# Patient Record
Sex: Female | Born: 1970 | Race: White | Hispanic: No | Marital: Married | Smoking: Never smoker
Health system: Southern US, Community
[De-identification: ages and names within clinical notes are randomized; demographics above are authoritative.]

## PROBLEM LIST (undated history)

## (undated) DIAGNOSIS — Z01419 Encounter for gynecological examination (general) (routine) without abnormal findings: Secondary | ICD-10-CM

## (undated) DIAGNOSIS — K589 Irritable bowel syndrome without diarrhea: Secondary | ICD-10-CM

## (undated) DIAGNOSIS — R51 Headache: Secondary | ICD-10-CM

## (undated) DIAGNOSIS — T7840XA Allergy, unspecified, initial encounter: Secondary | ICD-10-CM

## (undated) DIAGNOSIS — F419 Anxiety disorder, unspecified: Secondary | ICD-10-CM

## (undated) DIAGNOSIS — I493 Ventricular premature depolarization: Secondary | ICD-10-CM

## (undated) DIAGNOSIS — R079 Chest pain, unspecified: Secondary | ICD-10-CM

## (undated) HISTORY — PX: ABDOMINAL HYSTERECTOMY: SHX81

## (undated) HISTORY — DX: Anxiety disorder, unspecified: F41.9

## (undated) HISTORY — PX: RHINOPLASTY: SUR1284

## (undated) HISTORY — PX: REDUCTION MAMMAPLASTY: SUR839

## (undated) HISTORY — PX: OTHER SURGICAL HISTORY: SHX169

## (undated) HISTORY — PX: SEPTOPLASTY: SUR1290

## (undated) HISTORY — DX: Allergy, unspecified, initial encounter: T78.40XA

## (undated) HISTORY — DX: Ventricular premature depolarization: I49.3

## (undated) HISTORY — PX: BREAST ENHANCEMENT SURGERY: SHX7

## (undated) HISTORY — PX: DIAGNOSTIC LAPAROSCOPY: SUR761

## (undated) HISTORY — DX: Encounter for gynecological examination (general) (routine) without abnormal findings: Z01.419

## (undated) HISTORY — DX: Chest pain, unspecified: R07.9

## (undated) HISTORY — DX: Irritable bowel syndrome, unspecified: K58.9

## (undated) HISTORY — DX: Headache: R51

---

## 1999-02-15 ENCOUNTER — Other Ambulatory Visit: Admission: RE | Admit: 1999-02-15 | Discharge: 1999-02-15 | Payer: Self-pay | Admitting: *Deleted

## 1999-03-21 ENCOUNTER — Encounter (INDEPENDENT_AMBULATORY_CARE_PROVIDER_SITE_OTHER): Payer: Self-pay | Admitting: Specialist

## 1999-03-21 ENCOUNTER — Other Ambulatory Visit: Admission: RE | Admit: 1999-03-21 | Discharge: 1999-03-21 | Payer: Self-pay | Admitting: *Deleted

## 1999-09-01 ENCOUNTER — Other Ambulatory Visit: Admission: RE | Admit: 1999-09-01 | Discharge: 1999-09-01 | Payer: Self-pay | Admitting: *Deleted

## 2000-01-11 ENCOUNTER — Other Ambulatory Visit: Admission: RE | Admit: 2000-01-11 | Discharge: 2000-01-11 | Payer: Self-pay | Admitting: *Deleted

## 2000-07-02 ENCOUNTER — Other Ambulatory Visit: Admission: RE | Admit: 2000-07-02 | Discharge: 2000-07-02 | Payer: Self-pay | Admitting: Obstetrics and Gynecology

## 2001-06-19 ENCOUNTER — Other Ambulatory Visit: Admission: RE | Admit: 2001-06-19 | Discharge: 2001-06-19 | Payer: Self-pay | Admitting: Obstetrics and Gynecology

## 2001-07-01 ENCOUNTER — Ambulatory Visit (HOSPITAL_COMMUNITY): Admission: RE | Admit: 2001-07-01 | Discharge: 2001-07-01 | Payer: Self-pay | Admitting: Obstetrics and Gynecology

## 2001-07-01 ENCOUNTER — Encounter: Payer: Self-pay | Admitting: Obstetrics and Gynecology

## 2002-10-29 ENCOUNTER — Other Ambulatory Visit: Admission: RE | Admit: 2002-10-29 | Discharge: 2002-10-29 | Payer: Self-pay | Admitting: Gynecology

## 2003-05-14 ENCOUNTER — Ambulatory Visit (HOSPITAL_COMMUNITY): Admission: RE | Admit: 2003-05-14 | Discharge: 2003-05-14 | Payer: Self-pay | Admitting: Gynecology

## 2003-05-14 ENCOUNTER — Encounter (INDEPENDENT_AMBULATORY_CARE_PROVIDER_SITE_OTHER): Payer: Self-pay | Admitting: *Deleted

## 2003-05-14 ENCOUNTER — Ambulatory Visit (HOSPITAL_BASED_OUTPATIENT_CLINIC_OR_DEPARTMENT_OTHER): Admission: RE | Admit: 2003-05-14 | Discharge: 2003-05-14 | Payer: Self-pay | Admitting: Gynecology

## 2003-05-14 HISTORY — PX: DILATION AND EVACUATION: SHX1459

## 2004-08-15 ENCOUNTER — Other Ambulatory Visit: Admission: RE | Admit: 2004-08-15 | Discharge: 2004-08-15 | Payer: Self-pay | Admitting: Obstetrics and Gynecology

## 2004-09-06 ENCOUNTER — Ambulatory Visit (HOSPITAL_COMMUNITY): Admission: RE | Admit: 2004-09-06 | Discharge: 2004-09-06 | Payer: Self-pay | Admitting: Specialist

## 2004-09-08 ENCOUNTER — Ambulatory Visit (HOSPITAL_COMMUNITY): Admission: RE | Admit: 2004-09-08 | Discharge: 2004-09-08 | Payer: Self-pay | Admitting: Specialist

## 2004-09-15 ENCOUNTER — Ambulatory Visit (HOSPITAL_COMMUNITY): Admission: RE | Admit: 2004-09-15 | Discharge: 2004-09-15 | Payer: Self-pay | Admitting: Specialist

## 2005-02-11 ENCOUNTER — Inpatient Hospital Stay (HOSPITAL_COMMUNITY): Admission: AD | Admit: 2005-02-11 | Discharge: 2005-02-11 | Payer: Self-pay | Admitting: Obstetrics and Gynecology

## 2005-04-30 ENCOUNTER — Inpatient Hospital Stay (HOSPITAL_COMMUNITY): Admission: AD | Admit: 2005-04-30 | Discharge: 2005-05-03 | Payer: Self-pay | Admitting: Obstetrics and Gynecology

## 2005-05-22 ENCOUNTER — Ambulatory Visit: Admission: RE | Admit: 2005-05-22 | Discharge: 2005-05-22 | Payer: Self-pay | Admitting: Obstetrics and Gynecology

## 2006-01-01 ENCOUNTER — Ambulatory Visit: Payer: Self-pay | Admitting: Family Medicine

## 2006-10-11 ENCOUNTER — Ambulatory Visit: Payer: Self-pay | Admitting: Family Medicine

## 2006-10-11 DIAGNOSIS — J309 Allergic rhinitis, unspecified: Secondary | ICD-10-CM | POA: Insufficient documentation

## 2006-10-11 DIAGNOSIS — R51 Headache: Secondary | ICD-10-CM

## 2006-10-11 DIAGNOSIS — R519 Headache, unspecified: Secondary | ICD-10-CM | POA: Insufficient documentation

## 2007-01-02 ENCOUNTER — Ambulatory Visit (HOSPITAL_COMMUNITY): Admission: RE | Admit: 2007-01-02 | Discharge: 2007-01-02 | Payer: Self-pay | Admitting: Gynecology

## 2007-03-31 ENCOUNTER — Ambulatory Visit: Payer: Self-pay | Admitting: Family Medicine

## 2007-03-31 DIAGNOSIS — R635 Abnormal weight gain: Secondary | ICD-10-CM

## 2007-04-04 ENCOUNTER — Telehealth: Payer: Self-pay | Admitting: Family Medicine

## 2007-04-04 LAB — CONVERTED CEMR LAB
ALT: 14 units/L (ref 0–35)
Alkaline Phosphatase: 63 units/L (ref 39–117)
Bilirubin, Direct: 0.1 mg/dL (ref 0.0–0.3)
CO2: 30 meq/L (ref 19–32)
Calcium: 9.9 mg/dL (ref 8.4–10.5)
HCT: 41.9 % (ref 36.0–46.0)
Lymphocytes Relative: 34.6 % (ref 12.0–46.0)
MCV: 91.9 fL (ref 78.0–100.0)
Monocytes Absolute: 0.6 10*3/uL (ref 0.2–0.7)
Monocytes Relative: 6.9 % (ref 3.0–11.0)
Neutro Abs: 4.7 10*3/uL (ref 1.4–7.7)
Neutrophils Relative %: 55.8 % (ref 43.0–77.0)
Potassium: 4.1 meq/L (ref 3.5–5.1)
Sodium: 140 meq/L (ref 135–145)
TSH: 0.6 microintl units/mL (ref 0.35–5.50)
Total Protein: 7 g/dL (ref 6.0–8.3)

## 2007-06-30 ENCOUNTER — Telehealth: Payer: Self-pay | Admitting: Family Medicine

## 2007-07-24 ENCOUNTER — Telehealth: Payer: Self-pay | Admitting: Family Medicine

## 2007-07-25 ENCOUNTER — Encounter: Payer: Self-pay | Admitting: Family Medicine

## 2007-10-15 ENCOUNTER — Ambulatory Visit: Payer: Self-pay | Admitting: Family Medicine

## 2007-10-15 DIAGNOSIS — F411 Generalized anxiety disorder: Secondary | ICD-10-CM

## 2007-10-15 DIAGNOSIS — K589 Irritable bowel syndrome without diarrhea: Secondary | ICD-10-CM

## 2007-10-16 ENCOUNTER — Encounter: Payer: Self-pay | Admitting: Family Medicine

## 2007-10-16 ENCOUNTER — Telehealth: Payer: Self-pay | Admitting: Family Medicine

## 2007-11-24 ENCOUNTER — Ambulatory Visit: Payer: Self-pay | Admitting: Family Medicine

## 2007-11-27 ENCOUNTER — Telehealth: Payer: Self-pay | Admitting: Family Medicine

## 2008-01-21 ENCOUNTER — Ambulatory Visit: Payer: Self-pay | Admitting: Family Medicine

## 2008-01-28 ENCOUNTER — Telehealth: Payer: Self-pay | Admitting: Family Medicine

## 2008-03-31 ENCOUNTER — Ambulatory Visit: Payer: Self-pay | Admitting: Family Medicine

## 2008-03-31 DIAGNOSIS — R5383 Other fatigue: Secondary | ICD-10-CM

## 2008-03-31 DIAGNOSIS — R5381 Other malaise: Secondary | ICD-10-CM | POA: Insufficient documentation

## 2008-04-02 LAB — CONVERTED CEMR LAB
ALT: 16 units/L (ref 0–35)
AST: 19 units/L (ref 0–37)
Alkaline Phosphatase: 65 units/L (ref 39–117)
Basophils Absolute: 0.1 10*3/uL (ref 0.0–0.1)
Bilirubin, Direct: 0 mg/dL (ref 0.0–0.3)
Calcium: 9.5 mg/dL (ref 8.4–10.5)
Eosinophils Absolute: 0.1 10*3/uL (ref 0.0–0.7)
Glucose, Bld: 85 mg/dL (ref 70–99)
HCT: 40.8 % (ref 36.0–46.0)
Hemoglobin: 14.1 g/dL (ref 12.0–15.0)
Lymphocytes Relative: 30.1 % (ref 12.0–46.0)
MCHC: 34.6 g/dL (ref 30.0–36.0)
Monocytes Absolute: 0.3 10*3/uL (ref 0.1–1.0)
Monocytes Relative: 3.9 % (ref 3.0–12.0)
Neutro Abs: 5.6 10*3/uL (ref 1.4–7.7)
Potassium: 4.2 meq/L (ref 3.5–5.1)
RDW: 11.7 % (ref 11.5–14.6)
Sodium: 142 meq/L (ref 135–145)
Total Bilirubin: 0.5 mg/dL (ref 0.3–1.2)
Vitamin B-12: 317 pg/mL (ref 211–911)

## 2008-04-07 ENCOUNTER — Telehealth: Payer: Self-pay | Admitting: Family Medicine

## 2008-05-20 ENCOUNTER — Encounter: Payer: Self-pay | Admitting: Family Medicine

## 2008-09-01 ENCOUNTER — Ambulatory Visit: Payer: Self-pay | Admitting: Family Medicine

## 2008-09-03 ENCOUNTER — Ambulatory Visit: Payer: Self-pay | Admitting: Family Medicine

## 2008-09-07 LAB — CONVERTED CEMR LAB
Cholesterol: 174 mg/dL (ref 0–200)
HDL: 53 mg/dL (ref >39.00)
Total CHOL/HDL Ratio: 3

## 2009-01-17 ENCOUNTER — Telehealth: Payer: Self-pay | Admitting: Family Medicine

## 2009-09-05 ENCOUNTER — Telehealth: Payer: Self-pay | Admitting: Family Medicine

## 2010-02-14 NOTE — Progress Notes (Signed)
Summary: refill xanax  Phone Note From Pharmacy   [facility]  [address] [phone] Call For: Chloe Conley  Summary of Call: refill xanax 0.5mg  1 by mouth two times a day as needed for anxiety Initial call taken by: Alfred Levins, CMA,  January 17, 2009 2:29 PM  Follow-up for Phone Call        call in #60 with 5 rf Follow-up by: Nelwyn Salisbury MD,  January 18, 2009 8:35 AM  Additional Follow-up for Phone Call Additional follow up Details #1::        Phone call completed, Pharmacist called Additional Follow-up by: Alfred Levins, CMA,  January 18, 2009 9:12 AM    Prescriptions: ALPRAZOLAM 0.5 MG TABS (ALPRAZOLAM) two times a day as needed anxiety  #60 x 5   Entered by:   Alfred Levins, CMA   Authorized by:   Nelwyn Salisbury MD   Signed by:   Alfred Levins, CMA on 01/18/2009   Method used:   Telephoned to ...       [facility]  [address] [phone][facility] (retail)       [address]       [place], [place]  [address]       Ph: [phone] or [phone]       Fax: [phone]   RxID:   [id number]

## 2010-02-14 NOTE — Progress Notes (Signed)
Summary: refill alprazolam  Phone Note Refill Request Message from:  Fax from Pharmacy on September 05, 2009 3:37 PM  Refills Requested: Medication #1:  ALPRAZOLAM 0.5 MG TABS two times a day as needed anxiety.   Dosage confirmed as above?Dosage Confirmed   Supply Requested: 1 month   Last Refilled: 07/12/2009  Method Requested: Fax to Local Pharmacy Initial call taken by: Raechel Ache, RN,  September 05, 2009 3:38 PM [phone]  Follow-up for Phone Call        call in #60 with 5 rf Follow-up by: Nelwyn Salisbury MD,  September 05, 2009 4:12 PM  Additional Follow-up for Phone Call Additional follow up Details #1::        Rx faxed to pharmacy Additional Follow-up by: Raechel Ache, RN,  September 05, 2009 4:15 PM    Prescriptions: ALPRAZOLAM 0.5 MG TABS (ALPRAZOLAM) two times a day as needed anxiety  #60 x 5   Entered by:   Raechel Ache, RN   Authorized by:   Nelwyn Salisbury MD   Signed by:   Raechel Ache, RN on 09/05/2009   Method used:   Historical   RxID:   [id number]

## 2010-02-22 ENCOUNTER — Telehealth: Payer: Self-pay | Admitting: Family Medicine

## 2010-02-22 NOTE — Telephone Encounter (Signed)
Pt needs new rx phentermine ?mg call into [phone]. Last time pt had med was 2 yrs ago.

## 2010-02-24 MED ORDER — PHENTERMINE HCL 37.5 MG PO CAPS: 37.5000 mg | ORAL_CAPSULE | ORAL | Status: DC

## 2010-02-24 NOTE — Telephone Encounter (Signed)
Pt notified and [facility]

## 2010-02-24 NOTE — Telephone Encounter (Signed)
Call in Phentermine 37.5 mg qd , #30 with 5 rf

## 2010-02-27 ENCOUNTER — Other Ambulatory Visit: Payer: Self-pay | Admitting: Family Medicine

## 2010-02-27 NOTE — Telephone Encounter (Signed)
Pt wants to know why her phentermine is 37.5 mg and her husb is 30mg  and also wants to know if she can take her xanax  With this.

## 2010-02-27 NOTE — Telephone Encounter (Signed)
Has ? About the phentermine rx that was called in. Please return call.

## 2010-02-27 NOTE — Telephone Encounter (Signed)
I gave her the 37.5 dosage because I have just recently started to try it. This is the highest dose. Yes it is okay to take Xanax with it.

## 2010-02-28 ENCOUNTER — Telehealth: Payer: Self-pay

## 2010-02-28 NOTE — Telephone Encounter (Signed)
error 

## 2010-02-28 NOTE — Telephone Encounter (Signed)
Pt aware and verbalized understanding.  

## 2010-03-02 ENCOUNTER — Other Ambulatory Visit: Payer: Self-pay | Admitting: Family Medicine

## 2010-04-12 ENCOUNTER — Other Ambulatory Visit: Payer: Self-pay

## 2010-04-13 MED ORDER — ALPRAZOLAM 0.5 MG PO TABS: 0.5000 mg | ORAL_TABLET | Freq: Two times a day (BID) | ORAL | Status: DC | PRN

## 2010-04-13 NOTE — Telephone Encounter (Signed)
Call in #60 with 5 rf 

## 2010-05-09 ENCOUNTER — Other Ambulatory Visit: Payer: Self-pay | Admitting: Family Medicine

## 2010-05-12 ENCOUNTER — Telehealth: Payer: Self-pay | Admitting: *Deleted

## 2010-05-12 NOTE — Telephone Encounter (Signed)
Pt turned over in bed last night and pulled a muscle between her shoulder blades and is asking for RX from Dr. Clent Ridges as she is going out of town this weekend.

## 2010-05-12 NOTE — Telephone Encounter (Signed)
Call in Flexeril 10 mg tid prn muscle spasms, #60 with 2 rf. Add 800 mg of Motrin every 6  Hours prn

## 2010-05-15 MED ORDER — CYCLOBENZAPRINE HCL 10 MG PO TABS: 10.0000 mg | ORAL_TABLET | Freq: Three times a day (TID) | ORAL | Status: DC | PRN

## 2010-05-17 ENCOUNTER — Other Ambulatory Visit: Payer: Self-pay | Admitting: Obstetrics and Gynecology

## 2010-06-02 NOTE — H&P (Signed)
NAME:  Chloe Conley, Chloe Conley                             ACCOUNT NO.:  [id number]   MEDICAL RECORD NO.:  [id number]                   PATIENT TYPE:  AMB   LOCATION:  [facility]                                 FACILITY:  [facility]   PHYSICIAN:  Juan H. Lily Peer, M.D.             DATE OF BIRTH:  1970-06-08   DATE OF ADMISSION:  DATE OF DISCHARGE:                                HISTORY & PHYSICAL   CHIEF COMPLAINT:  In vitro fertilization twin pregnancy with evidence of  missed AB.   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  Her last menstrual period had been reported to be January 25 which would  place her at approximately 57 1/[redacted] weeks gestation with a due date of  November 2.  She had three embryo transfers on March 9 which would place the  patient at 8 1/[redacted] weeks gestation which would concur with the recent  ultrasound two days ago with evidence of a twin gestation with no evidence  of cardiac activity at 8 1/[redacted] weeks gestation.  Sac #1 was empty and sac #2  was very small with fetal pole but no cardiac activity.  The patient denies  any vaginal bleeding.  She had some abdominal cramping.  She is scheduled to  undergo  D&E this coming Friday, April 29.   PAST MEDICAL HISTORY:  1. The patient has a mother with non-insulin dependent diabetes.   ALLERGIES:  She denies any allergies.   PAST SURGICAL HISTORY:  1. History of laparoscopy and hysteroscopy in the past.   MEDICATIONS:  1. She is currently on Celexa for depression.  2. Prenatal vitamins.  3. She was on progesterone suppository for luteal support this first     trimester.   PHYSICAL EXAMINATION:  VITAL SIGNS:  The patient weighs approximately 150  pounds, 5'7 tall, blood pressure 138/82.  HEENT:  Unremarkable.  The neck is supple.  The trachea is midline.  There  are no carotid bruits.  There is no thyromegaly.  LUNGS:  Clear to  auscultation without any rhonchi or wheezes.  HEART:  Regular rate and rhythm with no murmurs or gallops.  BREAST EXAM:  Not done.  ABDOMEN:  Soft and non-tender without rebound or guarding.  PELVIC:  Bartholin's, urethral and Skein's glands are within normal limits.  Vagina and cervix:  No lesions or discharge.  Uterus is anteverted,  approximately 8 to 10 weeks size.  There are no palpable adnexal masses.  RECTAL EXAM:  Not done.   ASSESSMENT:  [age]-year-old Gravida II, Para 0, now AB 2 with first  trimester missed AB, twin gestation, pregnancy is a result of in vitro  fertilization.   PLAN:  The patient will be taken to the operating room this coming Friday  for  a dilatation and evacuation.  The risks, benefits, pros and cons of the  procedure were discussed including infection, bleeding and trauma to  internal organs.  She will receive antibiotics for prophylaxis.  Also, due  to the fact that this is her second pregnancy loss we will proceed with  submitting tissue for chromosomal studies.  Her and her husband will be  tested to check for their karyotype to make sure there are no abnormal  chromosomes. Later on before her next pregnancy we will repeat her  hysteroscopy in the office. We will also proceed with doing antiphospholipid  antibodies, IgG and IgM and lupus anticoagulant which will be done tomorrow.  We will also be checking her blood type to make sure that she is not a Rh  candidate.  She does have a history of diabetes in her mother and she has  complained of increased weight, so along with the above mentioned blood  tests tomorrow, she will return back to the office the day prior to her  surgery for a fasting blood sugar and a TSH as well.  When she comes back to  the office to see me in three weeks for her postoperative visit we will  discuss these results and then schedule a hysteroscopy accordingly.  She was  given a prescription for Motrin 800 mg to take  t.i.d. after her procedure.  She was seen in the office several weeks back.  She had some rectal bleeding  which turns out that it appears to be a perirectal fissure.  She was given  Hemoccult cards to submit to the office and one out of three specimens was  positive for blood. We will go ahead and repeat this and follow-up probably  in the next couple of months after she recovers from the above. All  questions were answered and we will follow accordingly.  The patient is  scheduled for a dilatation and evacuation on Friday, May 14, 2003 at [facility].                                               Juan H. Lily Peer, M.D.    JHF/MEDQ  D:  05/12/2003  T:  05/12/2003  Job:  [id number]

## 2010-06-02 NOTE — Op Note (Signed)
NAME:  Chloe Conley, Chloe Conley                             ACCOUNT NO.:  [id number]   MEDICAL RECORD NO.:  [id number]                   PATIENT TYPE:  AMB   LOCATION:  [facility]                                 FACILITY:  [facility]   PHYSICIAN:  Juan H. Lily Peer, M.D.             DATE OF BIRTH:  [birth year]   DATE OF PROCEDURE:  05/14/2003  DATE OF DISCHARGE:                                 OPERATIVE REPORT   INDICATIONS FOR PROCEDURE:  A 40 year old, gravida 2, para 0 now AB 2, in  vitro pregnancy who has evidence of a first trimester missed AB.   PREOPERATIVE DIAGNOSES:  First trimester missed abortion with twins.   POSTOPERATIVE DIAGNOSES:  First trimester missed abortion with twins.   ANESTHESIA:  MAC and paracervical block consisting of 2% Xylocaine with  1:100,000 epinephrine.   SURGEON:  Juan H. Lily Peer, M.D.   PROCEDURE:  Dilatation and evacuation.   COMPLICATIONS:  None.   DESCRIPTION OF PROCEDURE:  After the patient was adequately counseled, she  was taken to the operating room where she underwent a successful intravenous  sedation. She was placed in low lithotomy position, the vagina and perineum  were prepped and draped in the usual sterile fashion. A red rubber Roxan Hockey  was inserted in an effort to evacuate his bladder of contents of  approximately 50 mL. She did receive a gram of Cefotan prophylactically.  An  examination under anesthesia demonstrated an 8-10 week size uterus with no  palpable adnexal masses. The anterior cervical lip was grasped with a single  tooth tenaculum and the cervix was serially dilated to a size 33 Pratt  dilator.  A 10 mm suction curette was introduced into the intrauterine  cavity to remove the products of conception. This was interchanged with a  serrated curette to remove the remaining products of conception.  An aliquot  of the specimen will be submitted for carrier type and the rest was sent or  histological evaluation. The patient was given 10 units  of Pitocin and 500  mL of lactated Ringer's for additional uterotonic activity to cut down on  the bleeding.  The patient tolerated the procedure well. Of note, she did  receive 2% lidocaine with 1:100,000 epinephrine which had been infiltrated  into the cervical stroma at the 2, 4, 8 and 10 o'clock position.  The  patient tolerated the procedure well and was transferred to the recovery  room with stable vital signs. Blood loss was less than 75 mL and IV fluids  with 500 mL of lactated Ringer's and her blood type is A positive.                                               Juan H. Lily Peer, M.D.    JHF/MEDQ  D:  05/14/2003  T:  05/14/2003  Job:  [id number]

## 2010-06-02 NOTE — Discharge Summary (Signed)
NAMEGWENDOLYNE, [name] NO.:  [id number]   MEDICAL RECORD NO.:  [id number]          PATIENT TYPE:  INP   LOCATION:  9119                          [facility]   PHYSICIAN:  Carrington Clamp, M.D. DATE OF BIRTH:  [birth year]   DATE OF ADMISSION:  04/30/2005  DATE OF DISCHARGE:  05/03/2005                                 DISCHARGE SUMMARY   FINAL DIAGNOSIS:  Intrauterine gestation at 39 weeks, unstable fetal lie,  desire for cesarean section, and mild postoperative anemia.   PROCEDURE:  Primary low segment transverse cesarean section with vacuum  assistance for delivery of fetal vertex.   SURGEON:  Randye Lobo, M.D.   ASSISTANT:  Luvenia Redden, M.D.   COMPLICATIONS:  None.   This 40 year old G4, P 0-0-3-0, presents with [redacted] weeks gestation at term  with an unstable fetal lie that was noted and followed in the office.  The  fetal presentation has ranged from breech to transverse and to vertex.  The  patient has expressed her desire for an elective cesarean section regardless  of the fetal position.  The patient's antepartum course up to this point had  been complicated by a history of infertility. The patient did have in vitro  fertilization. The patient is advanced maternal age and did have first  trimester screening and normal quad screen, however, declined amniocentesis.  The patient also had a history of uterine adhesions. Otherwise, her  antepartum course had been uncomplicated.  She did have a positive group B  strep culture obtained in the office at 35 weeks.   She is taken to the operating room on April 30, 2005, by Dr. Conley Simmonds  where a primary low segment transverse cesarean section was performed with  vacuum assistance with the delivery of an 8 pound 10 ounce female infant with  Apgars of 9 and 9.  The placenta was noted to be adherent to the anterior  uterine fundus and questionable scar tissue in the right lower uterine  segment.  The tubes and  ovaries were normal. The patient and baby tolerated  the procedure well.  The patient's postoperative course was benign without  any significant fevers.  She did have a hemoglobin drop to 9.5.  She was  felt ready for discharge on postop day three.  She was sent home on a  regular diet, told to decrease her activities, told to continue on iron  supplement twice daily, was given Percocet 1-2 q.4h. p.r.n. pain.  She is to  follow up in the office in four weeks with Dr. Edward Jolly.  She is told to call  with any increased bleeding, pain or problems.  Labs on discharge revealed  the patient had a hemoglobin of 8.6, white blood cell count of 9.5,  platelets of 166,000.      Leilani Able, P.A.-C.      Carrington Clamp, M.D.  Electronically Signed    MB/MEDQ  D:  05/30/2005  T:  05/30/2005  Job:  [id number]

## 2010-06-02 NOTE — Assessment & Plan Note (Signed)
[facility]                            [facility] OFFICE NOTE   Chloe, Conley                          MRN:          [id number]  DATE:01/01/2006                            DOB:          Mar 11, 1970    This is a 40 year old woman here to establish with our practice,  complaining of headaches.  She had had intermittent headaches for years  which she felt were sinus headaches.  These were centered around the  forehead above and behind the eyes.  They are usually mild.  They were  controlled well with over-the-counter pain medications and did not seem  to affect her life very much.  Then, she became pregnant about a year-  and-a-half ago and her headaches completely went away.  She then gave  birth to her child 7 months ago and breast-fed the child for several  months.  She continued to do well during this time, and when she stopped  breastfeeding shortly thereafter she began having menstrual cycles again  and her headaches returned.  However, her headaches are now much more  severe than they were before.  She has several a month but almost always  has one just as her menstrual cycle begins.  It seems to begin in the  left forehead region above the left eye and then generalize to the  entire top of her head.  It is severe, it is throbbing.  She has a  sensitivity to light and sound.  She often is nauseated and may vomit.  She often has to lie down for a while as well.  Generally it is gone in  several hours or maybe the following day.  She has no visual changes, no  other neurologic symptoms.  She has a strong family history of migraine  headaches as noted below.   Other past medical history:  She is a G4, P1.  She and her husband had  extensive infertility workups prior to becoming pregnant and having  their child earlier this year.  She had several laparoscopy procedures.  She had a hysteroscopy.  They even did in vitro fertilization three  times.   None of these were successful.  She had a septoplasty and  rhinoplasty in 1998.  Otherwise unremarkable.   ALLERGIES:  None.   CURRENT MEDICATIONS:  None.   HABITS:  She does not use tobacco or alcohol.   SOCIAL HISTORY:  She is married, she is a Futures trader.   FAMILY HISTORY:  Remarkable for migraine headaches in her mother, also  some diabetes in the immediate family.   OTHER CONSULTING DOCTORS:  She sees Dr. Conley Simmonds for gynecology care  and Dr. Para Skeans for dermatology care.   OBJECTIVE:  Height 5 feet 7 inches, weight 157, BP 122/74, pulse 76 and  regular, temperature 98.0 degrees.  Today she seems to feel fine and she  appears to be quite healthy.  Eyes are clear.  A brief neurologic exam  is within normal limits   ASSESSMENT AND PLAN:  Migraine headaches.  We spent some time describing  the nature of migraine headaches and I gave her information to read  including foods to avoid.  She does  use a fair amount of caffeine and I asked her to try to cut back a great  deal on this.  Will try samples for Imitrex 100 mg tablets to use the  next time she gets a headache.  Also, Phenergan 25 mg tablets to use for  nausea.  She will follow up as needed.     Tera Mater. Clent Ridges, MD  Electronically Signed    SAF/MedQ  DD: 01/02/2006  DT: 01/02/2006  Job #: [id number]

## 2010-06-02 NOTE — Op Note (Signed)
NAMECARRINGTON, MULLENAX NO.:  [id number]   MEDICAL RECORD NO.:  [id number]          PATIENT TYPE:  INP   LOCATION:  9119                          FACILITY:  [facility]   PHYSICIAN:  Randye Lobo, M.D.   DATE OF BIRTH:  May 16, 1970   DATE OF PROCEDURE:  04/30/2005  DATE OF DISCHARGE:                                 OPERATIVE REPORT   PREOPERATIVE DIAGNOSIS:  1.  Intrauterine gestation at 39 weeks.  2.  Unstable fetal lie.  3.  Desire for cesarean section.   POSTOPERATIVE DIAGNOSIS:  1.  Intrauterine gestation at 39 weeks.  2.  Unstable fetal lie.  3.  Desire for cesarean section.   PROCEDURES:  A primary low segment transverse cesarean section with vacuum  assistance for delivery of fetal vertex.   SURGEON:  Conley Simmonds, M.D.   ASSISTANT:  Lodema Hong, M.D.   ANESTHESIA:  Is spinal.   IV FLUIDS:  3000 mL Ringer's lactate.   ESTIMATED BLOOD LOSS:  850 mL   URINE OUTPUT:  200 mL   COMPLICATIONS:  None.   INDICATIONS FOR PROCEDURE:  The patient is a 40 year old gravida 4, para 0-0-  3-0 Caucasian female at 16 weeks' gestation who at term has presented with  an unstable fetal lie noted and followed through the office.  The fetal  presentation has ranged from a breech to transverse to vertex.  The patient  has expressed a desire for elective cesarean section, regardless of fetal  position.  A plan is made to proceed with a primary low segment transverse  cesarean section after risks, benefits, and alternatives were discussed with  her.   FINDINGS:  A viable female was delivered and 9:34 a.m.  The weight was 8  pounds 10 ounces.  The Apgars were 9 at 1 minute and 9 at 5 minutes.  The  presentation was vertex.  The amniotic fluid was noted to be clear.  The  placenta was noted to be adherent to the anterior uterine fundus.  There was  a questionable area of some possible scar tissue noted in the right lower  uterine segment.  The tubes and ovaries were  unremarkable.   SPECIMENS:  None.   PROCEDURE:  The patient was reidentified in the preoperative hold area.  She  was brought to the operating room where a spinal anesthetic was  administered.  The patient's abdomen was sterilely prepped and a Foley  catheter was placed inside the bladder.  She was sterilely draped.   A Pfannenstiel incision was created sharply with scalpel.  The incision was  carried down to the fascia using a combination of a scalpel and monopolar  cautery for hemostasis.  The fascia was incised transversely and the  incision was extended with Mayo scissors bilaterally.  The rectus muscles  were sharply dissected off of the fascia superiorly and inferiorly.  The  rectus muscles were sharply divided in the midline.  The parietal peritoneum  was elevated with two hemostat clamps and entered sharply.  The peritoneal  incision was extended cranially and caudally.  The lower uterine segment was exposed with a bladder retractor and the  bladder flap was sharply created.  A transverse lower uterine segment  incision was created with the scalpel.  The uterine cavity was entered with  a hemostat clamp.  The uterine incision was extended bilaterally in an  upward fashion with the bandage scissors.  There was questionable scar  tissue appreciated when the uterine incision was extended along the right  lower uterine segment.  Membranes were ruptured with an Allis clamp and  clear fluid was noted.  The vertex was noted to be presenting but was high  and off to the patient's right-hand side.  The vertex was elevated to the  uterine incision, fundal pressure was applied and ultimately a Mityvac was  placed and used with appropriate pressure very easily for one attempt in  order to deliver the vertex.  The nares and mouth were suctioned.  The  remainder of the newborn infant was delivered.  The umbilical cord was  doubly clamped and cut the newborn was carried over to the  awaiting  pediatricians in vigorous condition.   Cord blood was obtained. The placenta was manually extracted.  It was  adhered to the anterior uterine fundus as noted above.  The placenta was set  aside for cord blood donation.  The uterine cavity was examined and any  remaining products of conception and membranes were removed.   The uterus was exteriorized for its closure.  It was closed with a double  layer closure of #1 chromic.  The first was a running locked layer and the  second was an imbricating layer.  A figure-of-eight suture was placed in the  right apical region to create hemostasis again using #1 chromic.   The uterus was returned to the peritoneal cavity which was irrigated and  suctioned.  The uterine incision was hemostatic.   The abdomen was closed.  The peritoneum was closed with a running suture of  2-0 Vicryl.  The rectus muscles were reapproximated in the midline with  interrupted sutures of #1 chromic.  The fascia was closed with a running  suture of 0 Vicryl.  The subcutaneous tissue was irrigated and suctioned and  made hemostatic with monopolar cautery.  The skin was closed with staples.  A sterile bandage was placed over this.   This concluded the patient's surgery.  The were no complications.  All  needle, instrument, sponge counts were correct.      Randye Lobo, M.D.  Electronically Signed     BES/MEDQ  D:  04/30/2005  T:  04/30/2005  Job:  [id number]

## 2010-11-24 ENCOUNTER — Telehealth: Payer: Self-pay | Admitting: *Deleted

## 2010-11-24 NOTE — Telephone Encounter (Signed)
Refill request ALPRAZOLAM 0.5 MG TAB #60 X 5  Last filled 10/07/10

## 2010-11-27 NOTE — Telephone Encounter (Signed)
She needs an OV for this  

## 2010-11-27 NOTE — Telephone Encounter (Signed)
Left a message for pt to return call 

## 2010-11-28 NOTE — Telephone Encounter (Signed)
I spoke with pt and she will schedule a office visit. 

## 2010-12-06 ENCOUNTER — Ambulatory Visit: Payer: Self-pay | Admitting: Family Medicine

## 2010-12-11 ENCOUNTER — Ambulatory Visit (INDEPENDENT_AMBULATORY_CARE_PROVIDER_SITE_OTHER): Payer: Managed Care, Other (non HMO) | Admitting: Family Medicine

## 2010-12-11 ENCOUNTER — Encounter: Payer: Self-pay | Admitting: Family Medicine

## 2010-12-11 VITALS — BP 108/64 | HR 68 | Temp 98.3°F | Wt 136.0 lb

## 2010-12-11 DIAGNOSIS — K589 Irritable bowel syndrome without diarrhea: Secondary | ICD-10-CM

## 2010-12-11 DIAGNOSIS — F419 Anxiety disorder, unspecified: Secondary | ICD-10-CM

## 2010-12-11 DIAGNOSIS — F411 Generalized anxiety disorder: Secondary | ICD-10-CM

## 2010-12-11 MED ORDER — CITALOPRAM HYDROBROMIDE 20 MG PO TABS: 20.0000 mg | ORAL_TABLET | Freq: Every day | ORAL | Status: DC

## 2010-12-11 MED ORDER — ALPRAZOLAM 0.5 MG PO TABS: 0.5000 mg | ORAL_TABLET | Freq: Two times a day (BID) | ORAL | Status: DC | PRN

## 2010-12-11 NOTE — Progress Notes (Signed)
  Subjective:    Patient ID: Chloe Conley, female    DOB: 11-11-1970, 40 y.o.   MRN: [id number]  HPI Here to discuss anxiety and IBS. She has done well with Xanax, and she takes this once or twice a day. However she is still anxious almost all the time, and she would like to try a daily medication again. Of the SSRIs she has tried in the past, the Celexa seemed to work the best. She sleeps well. Her husband is in [place] for 2 weeks out of 4 on his job, so she is by herself with the kids a lot. This is a great stress to her.    Review of Systems  Constitutional: Negative.   Respiratory: Negative.   Cardiovascular: Negative.   Psychiatric/Behavioral: Negative for dysphoric mood. The patient is nervous/anxious.        Objective:   Physical Exam  Constitutional: She appears well-developed and well-nourished.  Cardiovascular: Normal rate, regular rhythm, normal heart sounds and intact distal pulses.   Pulmonary/Chest: Effort normal and breath sounds normal.  Psychiatric: She has a normal mood and affect. Her behavior is normal. Judgment and thought content normal.          Assessment & Plan:  Get back on Celexa daily with prn Xanax. Recheck in several weeks

## 2011-01-29 ENCOUNTER — Other Ambulatory Visit (INDEPENDENT_AMBULATORY_CARE_PROVIDER_SITE_OTHER): Payer: Managed Care, Other (non HMO)

## 2011-01-29 DIAGNOSIS — Z0000 Encounter for general adult medical examination without abnormal findings: Secondary | ICD-10-CM

## 2011-01-29 LAB — HEPATIC FUNCTION PANEL: Total Protein: 7.4 g/dL (ref 6.0–8.3)

## 2011-01-29 LAB — CBC WITH DIFFERENTIAL/PLATELET
Eosinophils Absolute: 0.1 10*3/uL (ref 0.0–0.7)
Hemoglobin: 14.1 g/dL (ref 12.0–15.0)
Lymphocytes Relative: 34.3 % (ref 12.0–46.0)
Monocytes Relative: 7.3 % (ref 3.0–12.0)
Neutro Abs: 3.4 10*3/uL (ref 1.4–7.7)
Platelets: 216 10*3/uL (ref 150.0–400.0)

## 2011-01-29 LAB — POCT URINALYSIS DIPSTICK
Ketones, UA: NEGATIVE
Leukocytes, UA: NEGATIVE
Spec Grav, UA: >=1.03
Urobilinogen, UA: 0.2

## 2011-01-29 LAB — BASIC METABOLIC PANEL
BUN: 13 mg/dL (ref 6–23)
CO2: 28 mEq/L (ref 19–32)
Creatinine, Ser: 1 mg/dL (ref 0.4–1.2)
Glucose, Bld: 84 mg/dL (ref 70–99)
Sodium: 141 mEq/L (ref 135–145)

## 2011-01-29 LAB — LIPID PANEL
HDL: 70.7 mg/dL (ref >39.00)
Total CHOL/HDL Ratio: 3

## 2011-01-29 LAB — LDL CHOLESTEROL, DIRECT: Direct LDL: 120.7 mg/dL

## 2011-01-30 NOTE — Progress Notes (Signed)
Quick Note:  Spoke with pt ______ 

## 2011-02-05 ENCOUNTER — Encounter: Payer: Managed Care, Other (non HMO) | Admitting: Family Medicine

## 2011-02-08 ENCOUNTER — Telehealth: Payer: Self-pay | Admitting: Family Medicine

## 2011-02-08 MED ORDER — CITALOPRAM HYDROBROMIDE 20 MG PO TABS: 20.0000 mg | ORAL_TABLET | Freq: Every day | ORAL | Status: DC

## 2011-02-08 NOTE — Telephone Encounter (Signed)
done

## 2011-02-08 NOTE — Telephone Encounter (Signed)
Refill request for Citalopram 20 mg take 1 po qd and a 90 day supply to [facility], pt last here on 12/11/10.

## 2011-04-02 ENCOUNTER — Encounter: Payer: Managed Care, Other (non HMO) | Admitting: Family Medicine

## 2011-05-16 ENCOUNTER — Encounter: Payer: Managed Care, Other (non HMO) | Admitting: Family Medicine

## 2011-05-31 ENCOUNTER — Ambulatory Visit (INDEPENDENT_AMBULATORY_CARE_PROVIDER_SITE_OTHER): Payer: Self-pay | Admitting: Family Medicine

## 2011-05-31 ENCOUNTER — Encounter: Payer: Self-pay | Admitting: Family Medicine

## 2011-05-31 VITALS — BP 110/62 | HR 99 | Temp 97.7°F | Ht 66.5 in | Wt 132.0 lb

## 2011-05-31 DIAGNOSIS — Z0000 Encounter for general adult medical examination without abnormal findings: Secondary | ICD-10-CM

## 2011-05-31 NOTE — Progress Notes (Signed)
  Subjective:    Patient ID: Chloe Conley, female    DOB: 1970-12-04, 41 y.o.   MRN: [id number]  HPI 41 yr old female for a cpx. She feels great and has no concerns. She averages 1-2 migraines a week but they are mild for the most part. She does Zumba for exercise.    Review of Systems  Constitutional: Negative.   HENT: Negative.   Eyes: Negative.   Respiratory: Negative.   Cardiovascular: Negative.   Gastrointestinal: Negative.   Genitourinary: Negative for dysuria, urgency, frequency, hematuria, flank pain, decreased urine volume, enuresis, difficulty urinating, pelvic pain and dyspareunia.  Musculoskeletal: Negative.   Skin: Negative.   Neurological: Negative.   Hematological: Negative.   Psychiatric/Behavioral: Negative.        Objective:   Physical Exam  Constitutional: She is oriented to person, place, and time. She appears well-developed and well-nourished. No distress.  HENT:  Head: Normocephalic and atraumatic.  Right Ear: External ear normal.  Left Ear: External ear normal.  Nose: Nose normal.  Mouth/Throat: Oropharynx is clear and moist. No oropharyngeal exudate.  Eyes: Conjunctivae and EOM are normal. Pupils are equal, round, and reactive to light. No scleral icterus.  Neck: Normal range of motion. Neck supple. No JVD present. No thyromegaly present.  Cardiovascular: Normal rate, regular rhythm, normal heart sounds and intact distal pulses.  Exam reveals no gallop and no friction rub.   No murmur heard. Pulmonary/Chest: Effort normal and breath sounds normal. No respiratory distress. She has no wheezes. She has no rales. She exhibits no tenderness.  Abdominal: Soft. Bowel sounds are normal. She exhibits no distension and no mass. There is no tenderness. There is no rebound and no guarding.  Musculoskeletal: Normal range of motion. She exhibits no edema and no tenderness.  Lymphadenopathy:    She has no cervical adenopathy.  Neurological: She is alert and oriented to  person, place, and time. She has normal reflexes. No cranial nerve deficit. She exhibits normal muscle tone. Coordination normal.  Skin: Skin is warm and dry. No rash noted. No erythema.  Psychiatric: She has a normal mood and affect. Her behavior is normal. Judgment and thought content normal.          Assessment & Plan:  Well exam.

## 2011-07-10 ENCOUNTER — Telehealth: Payer: Self-pay | Admitting: Family Medicine

## 2011-07-10 NOTE — Telephone Encounter (Signed)
Refill request for Alprazolam 0.5 mg take 1 po bid prn and pt here on 05/31/11.

## 2011-07-10 NOTE — Telephone Encounter (Signed)
Call in #60 with 5 rf 

## 2011-07-11 MED ORDER — ALPRAZOLAM 0.5 MG PO TABS: 0.5000 mg | ORAL_TABLET | Freq: Two times a day (BID) | ORAL | Status: DC | PRN

## 2011-07-11 NOTE — Telephone Encounter (Signed)
I called in script 

## 2011-10-26 ENCOUNTER — Telehealth: Payer: Self-pay | Admitting: Family Medicine

## 2011-10-26 NOTE — Telephone Encounter (Signed)
Call in #12 with 11 rf 

## 2011-10-26 NOTE — Telephone Encounter (Signed)
Pt is on sumatriptan. She used to use mail order, but with their new insurance, [facility] [place]. [facility]. Thank you. Pt has 24 tabs left, just wants the rx on file w[facility] so it is there when she runs out.

## 2011-10-26 NOTE — Telephone Encounter (Signed)
How many can we send in?

## 2011-10-29 MED ORDER — SUMATRIPTAN SUCCINATE 100 MG PO TABS: 100.0000 mg | ORAL_TABLET | ORAL | Status: DC | PRN

## 2011-10-29 NOTE — Telephone Encounter (Signed)
I sent script e-scribe. 

## 2011-12-10 ENCOUNTER — Other Ambulatory Visit: Payer: Self-pay | Admitting: Family Medicine

## 2012-01-23 ENCOUNTER — Ambulatory Visit (INDEPENDENT_AMBULATORY_CARE_PROVIDER_SITE_OTHER): Payer: BC Managed Care – PPO | Admitting: Family Medicine

## 2012-01-23 ENCOUNTER — Encounter: Payer: Self-pay | Admitting: Family Medicine

## 2012-01-23 VITALS — BP 110/82 | HR 105 | Temp 98.7°F | Wt 144.0 lb

## 2012-01-23 DIAGNOSIS — J111 Influenza due to unidentified influenza virus with other respiratory manifestations: Secondary | ICD-10-CM

## 2012-01-23 MED ORDER — ALPRAZOLAM 0.5 MG PO TABS: 0.5000 mg | ORAL_TABLET | Freq: Two times a day (BID) | ORAL | Status: DC | PRN

## 2012-01-23 MED ORDER — OSELTAMIVIR PHOSPHATE 75 MG PO CAPS: 75.0000 mg | ORAL_CAPSULE | Freq: Two times a day (BID) | ORAL | Status: DC

## 2012-01-23 NOTE — Progress Notes (Signed)
  Subjective:    Patient ID: Chloe Conley, female    DOB: 1970-12-19, 42 y.o.   MRN: [id number]  HPI Here for 3 days of body aches, drenching sweats, chills, HA, nausea without vomiting, and a dry cough. No diarrhea. Drinking fluids.    Review of Systems  Constitutional: Positive for fever, chills, diaphoresis and fatigue.  HENT: Negative.   Eyes: Negative.   Respiratory: Positive for cough.   Gastrointestinal: Positive for nausea. Negative for vomiting, abdominal pain, diarrhea, constipation, blood in stool and abdominal distention.  Genitourinary: Negative.        Objective:   Physical Exam  Constitutional: She appears well-developed and well-nourished. No distress.  HENT:  Right Ear: External ear normal.  Left Ear: External ear normal.  Nose: Nose normal.  Mouth/Throat: Oropharynx is clear and moist.  Eyes: Conjunctivae normal are normal.  Pulmonary/Chest: Effort normal and breath sounds normal.  Abdominal: Soft. Bowel sounds are normal. She exhibits no distension and no mass. There is no tenderness. There is no rebound and no guarding.  Lymphadenopathy:    She has no cervical adenopathy.          Assessment & Plan:  Influenza. Given Tamiflu. Add Mucinex and Delsym prn

## 2012-03-13 ENCOUNTER — Telehealth: Payer: Self-pay | Admitting: Family Medicine

## 2012-03-13 NOTE — Telephone Encounter (Signed)
Patient is requesting a new rx on Phentermine. Per [facility] she had taken 37.5mg , but in Centric[facility], she was taking 30mg . She PREFERS the 30mg . She hasn't been on it in some time, but has been seen recently by Dr. Clent Ridges. Wants to know if she can get new rx without OV. She will be in in May 2014 for physical and labs.  [facility].

## 2012-03-14 MED ORDER — PHENTERMINE HCL 30 MG PO CAPS: 30.0000 mg | ORAL_CAPSULE | ORAL | Status: DC

## 2012-03-14 NOTE — Telephone Encounter (Signed)
Call in 30 mg daily for 6 months

## 2012-03-14 NOTE — Telephone Encounter (Signed)
Rx called in to pharmacy. 

## 2012-05-26 ENCOUNTER — Other Ambulatory Visit: Payer: Self-pay | Admitting: Family Medicine

## 2012-05-29 NOTE — Telephone Encounter (Signed)
Okay for one year  

## 2012-06-27 ENCOUNTER — Telehealth: Payer: Self-pay | Admitting: Family Medicine

## 2012-06-27 MED ORDER — SUMATRIPTAN SUCCINATE 100 MG PO TABS: 100.0000 mg | ORAL_TABLET | ORAL | Status: DC | PRN

## 2012-06-27 NOTE — Telephone Encounter (Signed)
I sent script e-scribe. 

## 2012-06-27 NOTE — Telephone Encounter (Signed)
PT calling to inquire about her SUMAtriptan (IMITREX) 100 MG tablet RX. She wanted to know if there was any way she could obtain a larger quantity on her RX. She's down to 3 tablets, and she is concerned about running out completely. She states that with the pressure in the air lately, she's had terrible migraines, and had to use it more often than usual. [facility].

## 2012-06-27 NOTE — Telephone Encounter (Signed)
Refill for #30 with 11 rf

## 2012-07-09 ENCOUNTER — Other Ambulatory Visit: Payer: Self-pay | Admitting: Family Medicine

## 2012-07-11 ENCOUNTER — Telehealth: Payer: Self-pay | Admitting: Family Medicine

## 2012-07-11 NOTE — Telephone Encounter (Signed)
I left voice message, if pt is indeed taking more than 9 of these per month, then Dr. Clent Ridges recommends a office visit to discuss this.

## 2012-07-11 NOTE — Telephone Encounter (Signed)
Patient's prior auth for #30 for 30 on her sumatriptan will not be approved. New regulations for [employer] [place] regarding triptan meds for migraines require: - patients experiencing more than 4 migraines/month must have a 2-3 month trial of prophylactic therapy before pt can get more than 9 tablets of triptan per month.   She can get 9 (100mg  tabs) per month w/ no prior auth.   Please advise. Thank you.

## 2012-07-22 ENCOUNTER — Other Ambulatory Visit: Payer: Self-pay | Admitting: Family Medicine

## 2012-07-23 NOTE — Telephone Encounter (Signed)
Call in #60 with 5 rf 

## 2012-08-18 ENCOUNTER — Ambulatory Visit (INDEPENDENT_AMBULATORY_CARE_PROVIDER_SITE_OTHER): Payer: BC Managed Care – PPO | Admitting: Family Medicine

## 2012-08-18 ENCOUNTER — Encounter: Payer: Self-pay | Admitting: Family Medicine

## 2012-08-18 VITALS — BP 116/76 | HR 72 | Temp 98.3°F | Wt 146.0 lb

## 2012-08-18 DIAGNOSIS — R51 Headache: Secondary | ICD-10-CM

## 2012-08-18 MED ORDER — TOPIRAMATE 25 MG PO TABS: 25.0000 mg | ORAL_TABLET | Freq: Every day | ORAL | Status: DC

## 2012-08-18 NOTE — Progress Notes (Signed)
  Subjective:    Patient ID: Chloe Conley, female    DOB: Sep 23, 1970, 42 y.o.   MRN: [id number]  HPI Her for her migraines. Over the past few months her HAs have become more frequent, often having 4-5 a week. They have not changed in nature or intensity. Imitrex usually works well for her. She is exercising again.    Review of Systems  Constitutional: Negative.   Respiratory: Negative.   Cardiovascular: Negative.   Neurological: Positive for headaches. Negative for dizziness, tremors, seizures, syncope, facial asymmetry, speech difficulty, weakness, light-headedness and numbness.       Objective:   Physical Exam  Constitutional: She is oriented to person, place, and time. She appears well-developed and well-nourished.  Eyes: Conjunctivae and EOM are normal. Pupils are equal, round, and reactive to light.  Neurological: She is alert and oriented to person, place, and time.          Assessment & Plan:  We will start on Topamax daily. Recheck in 3 weeks.

## 2012-09-23 ENCOUNTER — Other Ambulatory Visit (INDEPENDENT_AMBULATORY_CARE_PROVIDER_SITE_OTHER): Payer: BC Managed Care – PPO

## 2012-09-23 DIAGNOSIS — Z0000 Encounter for general adult medical examination without abnormal findings: Secondary | ICD-10-CM

## 2012-09-23 LAB — BASIC METABOLIC PANEL
BUN: 11 mg/dL (ref 6–23)
CO2: 28 mEq/L (ref 19–32)
Calcium: 9.5 mg/dL (ref 8.4–10.5)
Chloride: 105 mEq/L (ref 96–112)
GFR: 59.17 mL/min — ABNORMAL LOW (ref >60.00)
Potassium: 4 mEq/L (ref 3.5–5.1)
Sodium: 139 mEq/L (ref 135–145)

## 2012-09-23 LAB — HEPATIC FUNCTION PANEL
AST: 19 U/L (ref 0–37)
Albumin: 4.2 g/dL (ref 3.5–5.2)
Alkaline Phosphatase: 46 U/L (ref 39–117)
Bilirubin, Direct: 0 mg/dL (ref 0.0–0.3)
Total Bilirubin: 0.6 mg/dL (ref 0.3–1.2)

## 2012-09-23 LAB — CBC WITH DIFFERENTIAL/PLATELET
Basophils Absolute: 0 10*3/uL (ref 0.0–0.1)
Eosinophils Relative: 1.5 % (ref 0.0–5.0)
HCT: 42.2 % (ref 36.0–46.0)
Lymphs Abs: 2.4 10*3/uL (ref 0.7–4.0)
MCHC: 33.7 g/dL (ref 30.0–36.0)
MCV: 92.5 fl (ref 78.0–100.0)
Monocytes Relative: 8.5 % (ref 3.0–12.0)
Platelets: 249 10*3/uL (ref 150.0–400.0)
RBC: 4.56 Mil/uL (ref 3.87–5.11)
WBC: 6.5 10*3/uL (ref 4.5–10.5)

## 2012-09-23 LAB — LIPID PANEL
HDL: 62.2 mg/dL (ref >39.00)
Triglycerides: 76 mg/dL (ref 0.0–149.0)

## 2012-09-23 LAB — POCT URINALYSIS DIPSTICK
Bilirubin, UA: NEGATIVE
Ketones, UA: NEGATIVE
Urobilinogen, UA: 0.2

## 2012-09-26 NOTE — Progress Notes (Signed)
Quick Note:  Pt has appointment on 09/30/12 will go over then. ______ 

## 2012-09-30 ENCOUNTER — Encounter: Payer: Self-pay | Admitting: Family Medicine

## 2012-09-30 ENCOUNTER — Ambulatory Visit (INDEPENDENT_AMBULATORY_CARE_PROVIDER_SITE_OTHER): Payer: BC Managed Care – PPO | Admitting: Family Medicine

## 2012-09-30 VITALS — BP 108/76 | HR 105 | Temp 98.0°F | Ht 66.75 in | Wt 142.0 lb

## 2012-09-30 DIAGNOSIS — Z23 Encounter for immunization: Secondary | ICD-10-CM

## 2012-09-30 DIAGNOSIS — Z0000 Encounter for general adult medical examination without abnormal findings: Secondary | ICD-10-CM

## 2012-09-30 MED ORDER — TOPIRAMATE 50 MG PO TABS: 50.0000 mg | ORAL_TABLET | Freq: Every day | ORAL | Status: DC

## 2012-09-30 NOTE — Progress Notes (Signed)
  Subjective:    Patient ID: Chloe Conley, female    DOB: 02-24-1970, 42 y.o.   MRN: [id number]  HPI 42 yr old female for a cpx. She is doing well in general. She has been on Topamax for about 6 weeks and has had a good response with her migraines. They are much less frequent now. She would like to increase the dose a bit. Also she has had trouble with bleeding hemorrhoids. She recently had an exam with her GYN, and she has referred to Dr. Loreta Ave for this.    Review of Systems  Constitutional: Negative.   HENT: Negative.   Eyes: Negative.   Respiratory: Negative.   Cardiovascular: Negative.   Gastrointestinal: Negative.   Genitourinary: Negative for dysuria, urgency, frequency, hematuria, flank pain, decreased urine volume, enuresis, difficulty urinating, pelvic pain and dyspareunia.  Musculoskeletal: Negative.   Skin: Negative.   Neurological: Negative.   Psychiatric/Behavioral: Negative.        Objective:   Physical Exam  Constitutional: She is oriented to person, place, and time. She appears well-developed and well-nourished. No distress.  HENT:  Head: Normocephalic and atraumatic.  Right Ear: External ear normal.  Left Ear: External ear normal.  Nose: Nose normal.  Mouth/Throat: Oropharynx is clear and moist. No oropharyngeal exudate.  Eyes: Conjunctivae and EOM are normal. Pupils are equal, round, and reactive to light. No scleral icterus.  Neck: Normal range of motion. Neck supple. No JVD present. No thyromegaly present.  Cardiovascular: Normal rate, regular rhythm, normal heart sounds and intact distal pulses.  Exam reveals no gallop and no friction rub.   No murmur heard. Pulmonary/Chest: Effort normal and breath sounds normal. No respiratory distress. She has no wheezes. She has no rales. She exhibits no tenderness.  Abdominal: Soft. Bowel sounds are normal. She exhibits no distension and no mass. There is no tenderness. There is no rebound and no guarding.  Musculoskeletal:  Normal range of motion. She exhibits no edema and no tenderness.  Lymphadenopathy:    She has no cervical adenopathy.  Neurological: She is alert and oriented to person, place, and time. She has normal reflexes. No cranial nerve deficit. She exhibits normal muscle tone. Coordination normal.  Skin: Skin is warm and dry. No rash noted. No erythema.  Psychiatric: She has a normal mood and affect. Her behavior is normal. Judgment and thought content normal.          Assessment & Plan:  Well exam. Increase Topamax to 50 mg qhs.

## 2012-10-06 ENCOUNTER — Telehealth: Payer: Self-pay | Admitting: Family Medicine

## 2012-10-06 MED ORDER — CEPHALEXIN 500 MG PO CAPS: 500.0000 mg | ORAL_CAPSULE | Freq: Four times a day (QID) | ORAL | Status: DC

## 2012-10-06 NOTE — Telephone Encounter (Signed)
Patient does not have fever but diarrhea is continuing.  She has tried imodium, Pepto, and cutting her penicillin in half.  Please advise.

## 2012-10-06 NOTE — Telephone Encounter (Signed)
Rx sent to pharmacy and patient is aware 

## 2012-10-06 NOTE — Telephone Encounter (Signed)
Pt reports that on Thursday she began having loose stools.  Pt reports she is having 1-2 loose stools per day (dark green color, not formed).  Pt states her stomach feels bad all the time, no pain just a sick feeling.  On Friday she began having a sore throat and enlarged lymph nodes. [facility] and tested positive for strep.  Pt is taking Penicillin for her throat infection.  Since taking the Penicillin her stomach sickness feels worse (diarrhea has not increased but the her stomach feels upset all the time).  Pt wants to know if there is any other medication that she can take that may be easier on her stomach.  Office, please follow up with pt.

## 2012-10-06 NOTE — Telephone Encounter (Signed)
Stop the current antibiotic and switch to Keflex 500 mg tid. Call in #30

## 2012-12-22 ENCOUNTER — Other Ambulatory Visit: Payer: Self-pay | Admitting: Family Medicine

## 2012-12-23 ENCOUNTER — Telehealth: Payer: Self-pay | Admitting: Family Medicine

## 2012-12-23 NOTE — Telephone Encounter (Signed)
Caller: Chloe Conley/Patient; Phone: [phone]; Reason for Call: Chloe Conley says she had a colonoscopy 12/22/12 and an EKG was done; says it showed she had some PVCs and was told to f/u with her PCP; says they weren't concerned since her VS were stable; denies any sxs; Chloe Conley wants to know what she should do; does she need an acute appt or just f/u after the first of the yr; please call back

## 2012-12-24 NOTE — Telephone Encounter (Signed)
As long as she feels okay this can wait until after the holidays

## 2012-12-24 NOTE — Telephone Encounter (Signed)
I left voice message with the below information. 

## 2013-01-01 HISTORY — PX: COLONOSCOPY: SHX174

## 2013-01-15 DIAGNOSIS — I4729 Other ventricular tachycardia: Secondary | ICD-10-CM

## 2013-01-15 DIAGNOSIS — I472 Ventricular tachycardia: Secondary | ICD-10-CM

## 2013-01-15 HISTORY — DX: Other ventricular tachycardia: I47.29

## 2013-01-15 HISTORY — DX: Ventricular tachycardia: I47.2

## 2013-01-27 ENCOUNTER — Ambulatory Visit (INDEPENDENT_AMBULATORY_CARE_PROVIDER_SITE_OTHER): Payer: BC Managed Care – PPO | Admitting: Family Medicine

## 2013-01-27 ENCOUNTER — Telehealth: Payer: Self-pay | Admitting: Family Medicine

## 2013-01-27 ENCOUNTER — Encounter: Payer: Self-pay | Admitting: Family Medicine

## 2013-01-27 VITALS — BP 110/68 | HR 81 | Temp 98.0°F | Wt 145.0 lb

## 2013-01-27 DIAGNOSIS — I499 Cardiac arrhythmia, unspecified: Secondary | ICD-10-CM

## 2013-01-27 NOTE — Progress Notes (Signed)
   Subjective:    Patient ID: Chloe BoardsLori S Greek, female    DOB: Apr 10, 1970, 43 y.o.   MRN: [id number]  HPI Here to follow up on PVC's. She has never had these to her knowledge and we have never found any on our exams. She had a colonoscopy per Dr. Loreta AveMann on 12-22-12 and she was told she had some PVC's on a rhythm strip that day. She has never had sx from these, no palpitations or chest pain or SOB. She works out at a gym without difficulty. She typically has a few caffeinated drinks daily. Today she feels fine. She had normal labs here in September, including a TSH.    Review of Systems  Constitutional: Negative.   Respiratory: Negative.   Cardiovascular: Negative.        Objective:   Physical Exam  Constitutional: She appears well-developed and well-nourished.  Neck: No thyromegaly present.  Cardiovascular: Normal rate, normal heart sounds and intact distal pulses.  Exam reveals no gallop and no friction rub.   No murmur heard. She has frequent ectopic beats. EKG is normal except for trigeminy.   Pulmonary/Chest: Effort normal and breath sounds normal. No respiratory distress. She has no wheezes. She has no rales.  Lymphadenopathy:    She has no cervical adenopathy.          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 We will refer her to Cardiology for further evaluation. I advised her to limit her caffeine use

## 2013-01-27 NOTE — Telephone Encounter (Signed)
I spoke with pt and she is going to schedule a office visit to come back in and talk with Dr. Clent RidgesFry.

## 2013-01-27 NOTE — Telephone Encounter (Signed)
Pt was seen today and would like sylvia to return her call concerning her appt today

## 2013-01-27 NOTE — Progress Notes (Signed)
Pre visit review using our clinic review tool, if applicable. No additional management support is needed unless otherwise documented below in the visit note. 

## 2013-02-03 ENCOUNTER — Encounter: Payer: Self-pay | Admitting: Family Medicine

## 2013-02-03 ENCOUNTER — Ambulatory Visit (INDEPENDENT_AMBULATORY_CARE_PROVIDER_SITE_OTHER): Payer: BC Managed Care – PPO | Admitting: Family Medicine

## 2013-02-03 VITALS — BP 118/74 | HR 79 | Temp 98.5°F | Wt 148.0 lb

## 2013-02-03 DIAGNOSIS — I499 Cardiac arrhythmia, unspecified: Secondary | ICD-10-CM

## 2013-02-03 NOTE — Progress Notes (Signed)
   Subjective:    Patient ID: Chloe Conley, female    DOB: 1970/03/29, 43 y.o.   MRN: [id number]  HPI Here to recheck her ectopy. We saw her a week ago and an EKG showed a steady pattern of PVCs after every 3 beats. She has never been symptomatic. She has avoided using Imitrex and has used no caffeine at all in the past week because she thought these may have been contributing factors. She still feels fine.    Review of Systems  Constitutional: Negative.   Respiratory: Negative.   Cardiovascular: Negative.   Neurological: Negative.        Objective:   Physical Exam  Constitutional: She appears well-developed and well-nourished.  Cardiovascular: Normal rate, normal heart sounds and intact distal pulses.  Exam reveals no gallop and no friction rub.   No murmur heard. Ectopic beat after every 3 beats   Pulmonary/Chest: Effort normal and breath sounds normal.          Assessment & Plan:  She has the same rhythm, and eliminating Imitrex and caffeine has not helped. She is scheduled to see Cardiology this Friday.

## 2013-02-03 NOTE — Progress Notes (Signed)
Pre visit review using our clinic review tool, if applicable. No additional management support is needed unless otherwise documented below in the visit note. 

## 2013-02-05 ENCOUNTER — Ambulatory Visit: Payer: BC Managed Care – PPO | Admitting: Cardiology

## 2013-02-06 ENCOUNTER — Ambulatory Visit (INDEPENDENT_AMBULATORY_CARE_PROVIDER_SITE_OTHER): Payer: BC Managed Care – PPO | Admitting: Cardiology

## 2013-02-06 ENCOUNTER — Encounter: Payer: Self-pay | Admitting: Cardiology

## 2013-02-06 VITALS — BP 120/80 | HR 73 | Ht 67.0 in | Wt 146.0 lb

## 2013-02-06 DIAGNOSIS — I493 Ventricular premature depolarization: Secondary | ICD-10-CM

## 2013-02-06 DIAGNOSIS — I498 Other specified cardiac arrhythmias: Secondary | ICD-10-CM

## 2013-02-06 DIAGNOSIS — I4949 Other premature depolarization: Secondary | ICD-10-CM

## 2013-02-06 NOTE — Patient Instructions (Signed)
Your physician has requested that you have an echocardiogram. Echocardiography is a painless test that uses sound waves to create images of your heart. It provides your doctor with information about the size and shape of your heart and how well your heart's chambers and valves are working. This procedure takes approximately one hour. There are no restrictions for this procedure.  Your physician has recommended that you wear a 48 hour holter monitor. Holter monitors are medical devices that record the heart's electrical activity. Doctors most often use these monitors to diagnose arrhythmias. Arrhythmias are problems with the speed or rhythm of the heartbeat. The monitor is a small, portable device. You can wear one while you do your normal daily activities. This is usually used to diagnose what is causing palpitations/syncope (passing out).  Your physician recommends that you schedule a follow-up appointment in: after tests.

## 2013-02-06 NOTE — Progress Notes (Signed)
Patient ID: Chloe Conley, female   DOB: 07-06-1970, 43 y.o.   MRN: [id number]     Patient Name: Chloe Conley Date of Encounter: 02/06/2013  Primary Care Provider:  Nelwyn Salisbury, MD Primary Cardiologist:  Tobias Alexander, H  Problem List   Past Medical History  Diagnosis Date  . Allergy   . Anxiety   . Headache(784.0)   . IBS (irritable bowel syndrome)   . Routine gynecological examination     sees Dr. Waynard Reeds   Past Surgical History  Procedure Laterality Date  . Abdominal hysterectomy    . Rhinoplasty    . Septroplasty    . Laproscopy    . Vitro fertilization      x 3  . Breast enhancement surgery     Allergies  No Known Allergies  HPI  A very pleasant 43 year old female with no significant prior medical history that only include migraine headaches and hemorrhoids who has been noticed to have very frequent ventricular ectopy during her colonoscopy. The patient was seen by her primary care physician that noticed very frequent PVCs. The patient tried to stop drinking coffee and stop taking the Imitrex that she takes for her migraine headaches but the EKG had  still the same pattern of trigeminy The patient is completely asymptomatic she is not aware of the PVCs, she denies any palpitations dizziness or syncope. She also denies any shortness of breath or chest pain. There is no history of coronary artery disease in her family however her mind is being followed for aortic stenosis. No history of sudden cardiac death in her family Here to recheck her ectopy. We saw her a week ago and an EKG showed a steady pattern of PVCs after every 3 beats. She has never been symptomatic. She has avoided using Imitrex and has used no caffeine at all in the past week because she thought these may have been contributing factors. She still feels fine.   Home Medications  Prior to Admission medications   Medication Sig Start Date End Date Taking? Authorizing Provider  ALPRAZolam Prudy Feeler) 0.5 MG  tablet TAKE 1 TABLET BY MOUTH TWICE A DAY AS NEEDED 07/22/12  Yes Nelwyn Salisbury, MD  citalopram (CELEXA) 20 MG tablet TAKE 1 TABLET BY MOUTH EVERY DAY 05/26/12  Yes Nelwyn Salisbury, MD  SUMAtriptan (IMITREX) 100 MG tablet Take 1 tablet (100 mg total) by mouth every 2 (two) hours as needed for migraine. 06/27/12  Yes Nelwyn Salisbury, MD  topiramate (TOPAMAX) 50 MG tablet Take 1 tablet (50 mg total) by mouth daily. 09/30/12  Yes Nelwyn Salisbury, MD    Family History  Family History  Problem Relation Age of Onset  . Diabetes    . Migraines      Social History  History   Social History  . Marital Status: Married    Spouse Name: N/A    Number of Children: N/A  . Years of Education: N/A   Occupational History  . Not on file.   Social History Main Topics  . Smoking status: Never Smoker   . Smokeless tobacco: Never Used  . Alcohol Use: No  . Drug Use: No  . Sexual Activity: Not on file   Other Topics Concern  . Not on file   Social History Narrative  . No narrative on file     Review of Systems, as per HPI, otherwise negative General:  No chills, fever, night sweats or weight changes.  Cardiovascular:  No chest pain, dyspnea on exertion, edema, orthopnea, palpitations, paroxysmal nocturnal dyspnea. Dermatological: No rash, lesions/masses Respiratory: No cough, dyspnea Urologic: No hematuria, dysuria Abdominal:   No nausea, vomiting, diarrhea, bright red blood per rectum, melena, or hematemesis Neurologic:  No visual changes, wkns, changes in mental status. All other systems reviewed and are otherwise negative except as noted above.  Physical Exam  Blood pressure 120/80, pulse 73, height 5\' 7"  (1.702 m), weight 146 lb (66.225 kg), last menstrual period 01/15/2013.  General: Pleasant, NAD Psych: Normal affect. Neuro: Alert and oriented X 3. Moves all extremities spontaneously. HEENT: Normal  Neck: Supple without bruits or JVD. Lungs:  Resp regular and unlabored, CTA. Heart: RRR  no s3, s4, or murmurs. Abdomen: Soft, non-tender, non-distended, BS + x 4.  Extremities: No clubbing, cyanosis or edema. DP/PT/Radials 2+ and equal bilaterally.  Labs:  No results found for this basename: CKTOTAL, CKMB, TROPONINI,  in the last 72 hours Lab Results  Component Value Date   WBC 6.5 09/23/2012   HGB 14.2 09/23/2012   HCT 42.2 09/23/2012   MCV 92.5 09/23/2012   PLT 249.0 09/23/2012   No results found for this basename: NA, K, CL, CO2, BUN, CREATININE, CALCIUM, LABALBU, PROT, BILITOT, ALKPHOS, ALT, AST, GLUCOSE,  in the last 168 hours Lab Results  Component Value Date   CHOL 195 09/23/2012   HDL 62.20 09/23/2012   LDLCALC 118* 09/23/2012   TRIG 76.0 09/23/2012   No results found for this basename: DDIMER   No components found with this basename: POCBNP,   Accessory Clinical Findings  echocardiogram  ECG - sinus rhythm, frequent PVCs in a pattern of trigeminy.   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 We will order a 48 hour Holter monitoring to evaluate for PVC burden, also to evaluate if her PVCs are multifocal or multifocal and if there are any runs of ventricular tachycardia as well. The main concern in this case would be diagnosis of ARVC. There is no family history of SCD and the patient is completely asymptomatic. At this point we won't start any medication. We'll also order an echocardiogram for evaluation of LV and RV function. The labs had been performed recently and are normal including electrolytes kidney and liver function and TSH.   Lars MassonNELSON, Pricilla Moehle, H, MD, [facility] Center[facility] 02/06/2013, 11:47 AM

## 2013-02-10 ENCOUNTER — Encounter: Payer: Self-pay | Admitting: *Deleted

## 2013-02-10 ENCOUNTER — Encounter (INDEPENDENT_AMBULATORY_CARE_PROVIDER_SITE_OTHER): Payer: BC Managed Care – PPO

## 2013-02-10 DIAGNOSIS — I4949 Other premature depolarization: Secondary | ICD-10-CM

## 2013-02-10 DIAGNOSIS — I493 Ventricular premature depolarization: Secondary | ICD-10-CM

## 2013-02-10 DIAGNOSIS — I498 Other specified cardiac arrhythmias: Secondary | ICD-10-CM

## 2013-02-10 NOTE — Progress Notes (Signed)
Patient ID: Chloe Conley, female   DOB: [birth year], 43 y.o.   MRN: [id number] E-Cardio 48 hour holter monitor applied to patient.

## 2013-02-13 ENCOUNTER — Ambulatory Visit (HOSPITAL_COMMUNITY): Payer: BC Managed Care – PPO | Attending: Internal Medicine | Admitting: Cardiology

## 2013-02-13 ENCOUNTER — Encounter: Payer: Self-pay | Admitting: Internal Medicine

## 2013-02-13 DIAGNOSIS — I059 Rheumatic mitral valve disease, unspecified: Secondary | ICD-10-CM | POA: Insufficient documentation

## 2013-02-13 DIAGNOSIS — I498 Other specified cardiac arrhythmias: Secondary | ICD-10-CM

## 2013-02-13 DIAGNOSIS — I499 Cardiac arrhythmia, unspecified: Secondary | ICD-10-CM

## 2013-02-13 DIAGNOSIS — I493 Ventricular premature depolarization: Secondary | ICD-10-CM

## 2013-02-13 DIAGNOSIS — R5383 Other fatigue: Secondary | ICD-10-CM

## 2013-02-13 DIAGNOSIS — I4949 Other premature depolarization: Secondary | ICD-10-CM

## 2013-02-13 DIAGNOSIS — R5381 Other malaise: Secondary | ICD-10-CM

## 2013-02-13 NOTE — Progress Notes (Signed)
Echo performed. 

## 2013-02-16 ENCOUNTER — Telehealth: Payer: Self-pay | Admitting: General Surgery

## 2013-02-16 NOTE — Telephone Encounter (Signed)
Spoke with pt and she is aware. She asked if she needed to f/u with Dr Delton SeeNelson as scheduled this Friday. I told her I would ask her and then call her back. Pt gave permission for me to LVM on her phone if she did not answer. To Dr Delton SeeNelson to advise.

## 2013-02-16 NOTE — Telephone Encounter (Signed)
Follow up  ° ° °Calling back regarding test results.   °

## 2013-02-16 NOTE — Telephone Encounter (Signed)
Pt is aware. Referral sent up front to scheduling.

## 2013-02-16 NOTE — Telephone Encounter (Signed)
No need to follow with me

## 2013-02-16 NOTE — Telephone Encounter (Signed)
LVM for pt to return call to make aware we are referring to EP due to frequent PVCs on 48 hour Holter monitor.

## 2013-02-20 ENCOUNTER — Ambulatory Visit: Payer: BC Managed Care – PPO | Admitting: Cardiology

## 2013-03-04 ENCOUNTER — Encounter: Payer: Self-pay | Admitting: Internal Medicine

## 2013-03-04 ENCOUNTER — Encounter (INDEPENDENT_AMBULATORY_CARE_PROVIDER_SITE_OTHER): Payer: Self-pay

## 2013-03-04 ENCOUNTER — Ambulatory Visit (INDEPENDENT_AMBULATORY_CARE_PROVIDER_SITE_OTHER): Payer: BC Managed Care – PPO | Admitting: Internal Medicine

## 2013-03-04 VITALS — BP 123/78 | HR 67 | Ht 67.0 in | Wt 151.0 lb

## 2013-03-04 DIAGNOSIS — I493 Ventricular premature depolarization: Secondary | ICD-10-CM

## 2013-03-04 DIAGNOSIS — I4949 Other premature depolarization: Secondary | ICD-10-CM

## 2013-03-04 NOTE — Progress Notes (Signed)
c    ELECTROPHYSIOLOGY CONSULT NOTE  Patient ID: Chloe Conley, MRN: [id number], DOB/AGE: February 09, 1970 43 y.o. Admit date: (Not on file) Date of Consult: 03/04/2013  Primary Physician: Nelwyn Salisbury, MD Primary Cardiologist: KN  Chief Complaint:  PVC   HPI Chloe Conley is a 43 y.o. female  Referred for evaluation of PVCs. They were noted on her routine physical examination earlier this year and had not been appreciated previously. Her evaluation has included a Holter monitor which demonstrated 50-60% PVCs occurring mostly during waking hours. She's also had an echocardiogram that was normal.  She has no evidence of exercise intolerance. She denies edema tachycardia palpitations or syncope.  There is no family history of syncope or sudden death.    Past Medical History  Diagnosis Date  . Allergy   . Anxiety   . Headache(784.0)   . IBS (irritable bowel syndrome)   . Routine gynecological examination     sees Dr. Waynard Reeds      Surgical History:  Past Surgical History  Procedure Laterality Date  . Abdominal hysterectomy    . Rhinoplasty    . Septroplasty    . Laproscopy    . Vitro fertilization      x 3  . Breast enhancement surgery       Home Meds: Prior to Admission medications   Medication Sig Start Date End Date Taking? Authorizing Provider  ALPRAZolam Prudy Feeler) 0.5 MG tablet TAKE 1 TABLET BY MOUTH TWICE A DAY AS NEEDED 07/22/12  Yes Nelwyn Salisbury, MD  citalopram (CELEXA) 20 MG tablet TAKE 1 TABLET BY MOUTH EVERY DAY 05/26/12  Yes Nelwyn Salisbury, MD  SUMAtriptan (IMITREX) 100 MG tablet Take 1 tablet (100 mg total) by mouth every 2 (two) hours as needed for migraine. 06/27/12  Yes Nelwyn Salisbury, MD  topiramate (TOPAMAX) 50 MG tablet Take 1 tablet (50 mg total) by mouth daily. 09/30/12   Nelwyn Salisbury, MD      Allergies: No Known Allergies  History   Social History  . Marital Status: Married    Spouse Name: N/A    Number of Children: N/A  . Years of Education: N/A    Occupational History  . Not on file.   Social History Main Topics  . Smoking status: Never Smoker   . Smokeless tobacco: Never Used  . Alcohol Use: No  . Drug Use: No  . Sexual Activity: Not on file   Other Topics Concern  . Not on file   Social History Narrative  . No narrative on file     Family History  Problem Relation Age of Onset  . Diabetes    . Migraines       ROS:  Please see the history of present illness.     All other systems reviewed and negative.    Physical Exam: Blood pressure 123/78, pulse 67, height 5\' 7"  (1.702 m), weight 151 lb (68.493 kg). General: Well developed, well nourished female in no acute distress. Head: Normocephalic, atraumatic, sclera non-icteric, no xanthomas, nares are without discharge. EENT: normal Lymph Nodes:  none Back: without scoliosis/kyphosis, no CVA tendersness Neck: Negative for carotid bruits. JVD not elevated. Lungs: Clear bilaterally to auscultation without wheezes, rales, or rhonchi. Breathing is unlabored. Heart:regularly irregular RR with S1 S2 in a pattern of trigeminy. No  murmur , rubs, or gallops appreciated. Abdomen: Soft, non-tender, non-distended with normoactive bowel sounds. No hepatomegaly. No rebound/guarding. No obvious abdominal masses. Msk:  Strength and  tone appear normal for age. Extremities: No clubbing or cyanosis. No  edema.  Distal pedal pulses are 2+ and equal bilaterally. Skin: Warm and Dry Neuro: Alert and oriented X 3. CN III-XII intact Grossly normal sensory and motor function . Psych:  Responds to questions appropriately with a normal affect.      Labs: Cardiac Enzymes No results found for this basename: CKTOTAL, CKMB, TROPONINI,  in the last 72 hours CBC Lab Results  Component Value Date   WBC 6.5 09/23/2012   HGB 14.2 09/23/2012   HCT 42.2 09/23/2012   MCV 92.5 09/23/2012   PLT 249.0 09/23/2012   PROTIME: No results found for this basename: LABPROT, INR,  in the last 72 hours Chemistry  No results found for this basename: NA, K, CL, CO2, BUN, CREATININE, CALCIUM, LABALBU, PROT, BILITOT, ALKPHOS, ALT, AST, GLUCOSE,  in the last 168 hours Lipids Lab Results  Component Value Date   CHOL 195 09/23/2012   HDL 62.20 09/23/2012   LDLCALC 118* 09/23/2012   TRIG 76.0 09/23/2012   BNP No results found for this basename: probnp   Miscellaneous No results found for this basename: DDIMER    Radiology/Studies:  No results found.  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 Her echocardiogram demonstrates normal left ventricular function and there is no symptomatic exercise intolerance. In the absence of symptoms or evidence of left ventricular dysfunction at this juncture we'll just follow along. He is advised to let us know if there is any changes in her exercise performance. We will check her again with an echo in 6 months.    Sherryl MangesSteven Scarlet Abad

## 2013-03-04 NOTE — Patient Instructions (Signed)
Your physician recommends that you continue on your current medications as directed. Please refer to the Current Medication list given to you today.  Your physician has requested that you have an echocardiogram. Echocardiography is a painless test that uses sound waves to create images of your heart. It provides your doctor with information about the size and shape of your heart and how well your heart's chambers and valves are working. This procedure takes approximately one hour. There are no restrictions for this procedure.  Your physician wants you to follow-up in: 6 months with Dr. Klein. You will receive a reminder letter in the mail two months in advance. If you don't receive a letter, please call our office to schedule the follow-up appointment.  

## 2013-03-17 ENCOUNTER — Other Ambulatory Visit: Payer: Self-pay | Admitting: Obstetrics and Gynecology

## 2013-03-19 ENCOUNTER — Ambulatory Visit: Payer: BC Managed Care – PPO | Admitting: Family Medicine

## 2013-03-20 ENCOUNTER — Other Ambulatory Visit: Payer: Self-pay | Admitting: Family Medicine

## 2013-03-23 NOTE — Telephone Encounter (Signed)
Call in #60 with 5 rf 

## 2013-03-26 ENCOUNTER — Telehealth: Payer: Self-pay | Admitting: Family Medicine

## 2013-03-26 NOTE — Telephone Encounter (Signed)
Patient Information:  Caller Name: Lawson FiscalLori  Phone: [phone]  Patient: Chloe Conley, Chloe Conley  Gender: Female  DOB: 22-Sep-1970  Age: [age] Years  PCP: Gershon CraneFry, [facility])  Pregnant: No  Office Follow Up:  Does the office need to follow up with this patient?: Yes  Instructions For The Office: Please call pt back after conferring with Dr. Clent RidgesFry.  Appetite suppressant (Phentermine) taken in past and recently dxed with PVCs (12/2013).  Pt wants to take Phentermine or another appetite suppressant (Rx or OTC) if recommended by Dr. Clent RidgesFry.  RN Note:  Please call pt back after conferring with Dr. Clent RidgesFry.  Symptoms  Reason For Call & Symptoms: Appetite suppressant (Phentermine) taken in past and recently dxed with PVCs (12/2013).  Pt wants to take Phentermine or other appetite suppressant (Rx or OTC) if recommended by Dr. Clent RidgesFry.  Reviewed Health History In EMR: Yes  Reviewed Medications In EMR: Yes  Reviewed Allergies In EMR: Yes  Reviewed Surgeries / Procedures: Yes  Date of Onset of Symptoms: 03/26/2013  Treatments Tried: Pt is working out but not losing weight.  Treatments Tried Worked: No OB / GYN:  LMP: 03/24/2013  Guideline(s) Used:  No Protocol Available - Information Only  Disposition Per Guideline:   Discuss with PCP and Callback by Nurse Today  Reason For Disposition Reached:   Nursing judgment  Advice Given:  Call Back If:  New symptoms develop  You become worse.  Patient Will Follow Care Advice:  YES

## 2013-03-27 NOTE — Telephone Encounter (Signed)
I would avoid all appetite suppressants because they are stimulants and could make the PVCs a lot worse

## 2013-03-27 NOTE — Telephone Encounter (Signed)
I spoke with pt  

## 2013-06-30 ENCOUNTER — Other Ambulatory Visit: Payer: Self-pay | Admitting: Family Medicine

## 2013-07-08 ENCOUNTER — Other Ambulatory Visit: Payer: Self-pay | Admitting: Family Medicine

## 2013-07-16 ENCOUNTER — Ambulatory Visit: Payer: BC Managed Care – PPO | Admitting: Family Medicine

## 2013-09-08 ENCOUNTER — Ambulatory Visit (HOSPITAL_COMMUNITY): Payer: BC Managed Care – PPO | Attending: Cardiology | Admitting: Cardiology

## 2013-09-08 DIAGNOSIS — I493 Ventricular premature depolarization: Secondary | ICD-10-CM

## 2013-09-08 DIAGNOSIS — I4949 Other premature depolarization: Secondary | ICD-10-CM | POA: Insufficient documentation

## 2013-09-08 NOTE — Progress Notes (Signed)
Echo performed. 

## 2013-09-28 ENCOUNTER — Encounter: Payer: Self-pay | Admitting: Internal Medicine

## 2013-09-28 NOTE — Telephone Encounter (Signed)
New message ° ° ° ° ° °Want echo results °

## 2013-09-28 NOTE — Telephone Encounter (Signed)
This encounter was created in error - please disregard.

## 2013-10-14 ENCOUNTER — Other Ambulatory Visit: Payer: Self-pay | Admitting: Family Medicine

## 2013-10-15 NOTE — Telephone Encounter (Signed)
Call in #60 with 5 rf 

## 2013-10-21 ENCOUNTER — Telehealth: Payer: Self-pay | Admitting: *Deleted

## 2013-10-21 DIAGNOSIS — I493 Ventricular premature depolarization: Secondary | ICD-10-CM

## 2013-10-21 NOTE — Telephone Encounter (Signed)
Left detailed message on personal voicemail advising that Dr. Graciela HusbandsKlein would like to see her in a year for follow up along with another holter monitor to evaluate PVC burden. Asked her to call back if she had further questions/concerns and informed her that she would receive a letter of reminder of holter/appt with Dr. Graciela HusbandsKlein in one year.

## 2013-11-24 ENCOUNTER — Encounter: Payer: Self-pay | Admitting: *Deleted

## 2013-11-24 ENCOUNTER — Other Ambulatory Visit (INDEPENDENT_AMBULATORY_CARE_PROVIDER_SITE_OTHER): Payer: BC Managed Care – PPO

## 2013-11-24 DIAGNOSIS — Z0000 Encounter for general adult medical examination without abnormal findings: Secondary | ICD-10-CM

## 2013-11-24 LAB — HEPATIC FUNCTION PANEL
ALBUMIN: 3.5 g/dL (ref 3.5–5.2)
ALT: 13 U/L (ref 0–35)
AST: 17 U/L (ref 0–37)
Alkaline Phosphatase: 54 U/L (ref 39–117)
BILIRUBIN TOTAL: 0.5 mg/dL (ref 0.2–1.2)
Bilirubin, Direct: 0.1 mg/dL (ref 0.0–0.3)
Total Protein: 7.2 g/dL (ref 6.0–8.3)

## 2013-11-24 LAB — POCT URINALYSIS DIPSTICK
BILIRUBIN UA: NEGATIVE
Glucose, UA: NEGATIVE
Ketones, UA: NEGATIVE
Leukocytes, UA: NEGATIVE
NITRITE UA: NEGATIVE
Protein, UA: NEGATIVE
SPEC GRAV UA: 1.025
Urobilinogen, UA: 0.2
pH, UA: 5.5

## 2013-11-24 LAB — CBC WITH DIFFERENTIAL/PLATELET
Basophils Absolute: 0 10*3/uL (ref 0.0–0.1)
Basophils Relative: 0.8 % (ref 0.0–3.0)
EOS PCT: 3.2 % (ref 0.0–5.0)
Eosinophils Absolute: 0.2 10*3/uL (ref 0.0–0.7)
HEMATOCRIT: 40.6 % (ref 36.0–46.0)
HEMOGLOBIN: 13.6 g/dL (ref 12.0–15.0)
LYMPHS ABS: 2 10*3/uL (ref 0.7–4.0)
LYMPHS PCT: 37.6 % (ref 12.0–46.0)
MCHC: 33.4 g/dL (ref 30.0–36.0)
MCV: 93.6 fl (ref 78.0–100.0)
Monocytes Absolute: 0.4 10*3/uL (ref 0.1–1.0)
Monocytes Relative: 7.9 % (ref 3.0–12.0)
NEUTROS ABS: 2.7 10*3/uL (ref 1.4–7.7)
Neutrophils Relative %: 50.5 % (ref 43.0–77.0)
Platelets: 252 10*3/uL (ref 150.0–400.0)
RBC: 4.34 Mil/uL (ref 3.87–5.11)
RDW: 12.3 % (ref 11.5–15.5)
WBC: 5.4 10*3/uL (ref 4.0–10.5)

## 2013-11-24 LAB — BASIC METABOLIC PANEL
BUN: 15 mg/dL (ref 6–23)
CO2: 27 mEq/L (ref 19–32)
Calcium: 9.2 mg/dL (ref 8.4–10.5)
Chloride: 106 mEq/L (ref 96–112)
Creatinine, Ser: 0.9 mg/dL (ref 0.4–1.2)
GFR: 69.2 mL/min (ref >60.00)
Glucose, Bld: 82 mg/dL (ref 70–99)
POTASSIUM: 4.2 meq/L (ref 3.5–5.1)
SODIUM: 142 meq/L (ref 135–145)

## 2013-11-24 LAB — LIPID PANEL
CHOL/HDL RATIO: 4
Cholesterol: 203 mg/dL — ABNORMAL HIGH (ref 0–200)
HDL: 56.1 mg/dL (ref >39.00)
LDL CALC: 131 mg/dL — AB (ref 0–99)
NonHDL: 146.9
TRIGLYCERIDES: 79 mg/dL (ref 0.0–149.0)
VLDL: 15.8 mg/dL (ref 0.0–40.0)

## 2013-11-24 LAB — TSH: TSH: 0.75 u[IU]/mL (ref 0.35–4.50)

## 2013-12-01 ENCOUNTER — Ambulatory Visit (INDEPENDENT_AMBULATORY_CARE_PROVIDER_SITE_OTHER): Payer: BC Managed Care – PPO | Admitting: Family Medicine

## 2013-12-01 ENCOUNTER — Encounter: Payer: Self-pay | Admitting: Family Medicine

## 2013-12-01 VITALS — BP 129/80 | HR 70 | Temp 98.1°F | Ht 67.0 in | Wt 160.0 lb

## 2013-12-01 DIAGNOSIS — Z0000 Encounter for general adult medical examination without abnormal findings: Secondary | ICD-10-CM

## 2013-12-01 NOTE — Progress Notes (Signed)
   Subjective:    Patient ID: Chloe BoardsLori S Benedict, female    DOB: 03-18-1970, 43 y.o.   MRN: [id number]  HPI 43 yr old female for a cpx. She feels well. She had a full workup for her PVCs in February with Dr. Graciela HusbandsKlein, and he felt these were benign. She never has symptoms from these.    Review of Systems  Constitutional: Negative.   HENT: Negative.   Eyes: Negative.   Respiratory: Negative.   Cardiovascular: Negative.   Gastrointestinal: Negative.   Genitourinary: Negative for dysuria, urgency, frequency, hematuria, flank pain, decreased urine volume, enuresis, difficulty urinating, pelvic pain and dyspareunia.  Musculoskeletal: Negative.   Skin: Negative.   Neurological: Negative.   Psychiatric/Behavioral: Negative.        Objective:   Physical Exam  Constitutional: She is oriented to person, place, and time. She appears well-developed and well-nourished. No distress.  HENT:  Head: Normocephalic and atraumatic.  Right Ear: External ear normal.  Left Ear: External ear normal.  Nose: Nose normal.  Mouth/Throat: Oropharynx is clear and moist. No oropharyngeal exudate.  Eyes: Conjunctivae and EOM are normal. Pupils are equal, round, and reactive to light. No scleral icterus.  Neck: Normal range of motion. Neck supple. No JVD present. No thyromegaly present.  Cardiovascular: Normal rate, normal heart sounds and intact distal pulses.  Exam reveals no gallop and no friction rub.   No murmur heard. Occasional ectopy   Pulmonary/Chest: Effort normal and breath sounds normal. No respiratory distress. She has no wheezes. She has no rales. She exhibits no tenderness.  Abdominal: Soft. Bowel sounds are normal. She exhibits no distension and no mass. There is no tenderness. There is no rebound and no guarding.  Musculoskeletal: Normal range of motion. She exhibits no edema or tenderness.  Lymphadenopathy:    She has no cervical adenopathy.  Neurological: She is alert and oriented to person, place,  and time. She has normal reflexes. No cranial nerve deficit. She exhibits normal muscle tone. Coordination normal.  Skin: Skin is warm and dry. No rash noted. No erythema.  Psychiatric: She has a normal mood and affect. Her behavior is normal. Judgment and thought content normal.          Assessment & Plan:  Well exam. I suggested she get regular exercise.

## 2013-12-01 NOTE — Progress Notes (Signed)
Pre visit review using our clinic review tool, if applicable. No additional management support is needed unless otherwise documented below in the visit note. 

## 2013-12-09 ENCOUNTER — Encounter: Payer: Self-pay | Admitting: Family Medicine

## 2014-01-25 ENCOUNTER — Other Ambulatory Visit: Payer: Self-pay | Admitting: Family Medicine

## 2014-01-28 ENCOUNTER — Other Ambulatory Visit: Payer: Self-pay | Admitting: Family Medicine

## 2014-01-29 NOTE — Telephone Encounter (Signed)
Can we refill this? Looks like there was a dose change?

## 2014-01-29 NOTE — Telephone Encounter (Signed)
She actually takes 50 mg a day. Refill for one year

## 2014-02-08 ENCOUNTER — Other Ambulatory Visit: Payer: Self-pay | Admitting: Family Medicine

## 2014-03-24 ENCOUNTER — Other Ambulatory Visit: Payer: Self-pay | Admitting: Obstetrics and Gynecology

## 2014-03-25 LAB — CYTOLOGY - PAP

## 2014-03-26 ENCOUNTER — Other Ambulatory Visit: Payer: Self-pay | Admitting: Obstetrics and Gynecology

## 2014-03-26 DIAGNOSIS — R928 Other abnormal and inconclusive findings on diagnostic imaging of breast: Secondary | ICD-10-CM

## 2014-03-31 ENCOUNTER — Ambulatory Visit
Admission: RE | Admit: 2014-03-31 | Discharge: 2014-03-31 | Disposition: A | Discharge status: Home / Self Care | Payer: BLUE CROSS/BLUE SHIELD | Source: Ambulatory Visit | Attending: Obstetrics and Gynecology | Admitting: Obstetrics and Gynecology

## 2014-03-31 DIAGNOSIS — R928 Other abnormal and inconclusive findings on diagnostic imaging of breast: Secondary | ICD-10-CM

## 2014-05-19 ENCOUNTER — Telehealth: Payer: Self-pay | Admitting: Family Medicine

## 2014-05-19 NOTE — Telephone Encounter (Signed)
Pt needs a hep titer, for her employer.  Pt needs to know what vaccines she needs and what she has had. Pt not sure exact titer she needs, pls advse

## 2014-05-20 NOTE — Telephone Encounter (Signed)
I spoke with pt and she will find out from employer exactly what she needs and then call us back.

## 2014-05-20 NOTE — Telephone Encounter (Signed)
I am sure they are talking about immunity to hepatitis B. If she has had the shots, we can do a test to see of she is immune. If she has not had the shots, then she would need to get the hep B series. She needs to find out from her employer exactly what they require

## 2014-07-19 ENCOUNTER — Other Ambulatory Visit: Payer: Self-pay | Admitting: Family

## 2014-08-06 ENCOUNTER — Other Ambulatory Visit: Payer: Self-pay | Admitting: Family Medicine

## 2014-10-04 ENCOUNTER — Other Ambulatory Visit: Payer: Self-pay | Admitting: *Deleted

## 2014-10-04 ENCOUNTER — Other Ambulatory Visit: Payer: Self-pay | Admitting: Family

## 2014-10-04 DIAGNOSIS — I493 Ventricular premature depolarization: Secondary | ICD-10-CM

## 2014-10-05 NOTE — Telephone Encounter (Signed)
Patient requesting refill on Citalopram (Celexa) 20 mg tablet.

## 2014-10-06 ENCOUNTER — Ambulatory Visit (INDEPENDENT_AMBULATORY_CARE_PROVIDER_SITE_OTHER): Payer: BLUE CROSS/BLUE SHIELD

## 2014-10-06 DIAGNOSIS — I493 Ventricular premature depolarization: Secondary | ICD-10-CM

## 2014-10-06 NOTE — Telephone Encounter (Signed)
Needs an Appt

## 2014-10-19 ENCOUNTER — Telehealth: Payer: Self-pay | Admitting: Internal Medicine

## 2014-10-19 NOTE — Telephone Encounter (Signed)
Please review holter in your in-basket!  Ordered as a 1 year follow up prior to office visit, but no appointment is scheduled with you.

## 2014-10-19 NOTE — Telephone Encounter (Signed)
New message  ° ° °Patient calling for test results.   °

## 2014-10-22 NOTE — Telephone Encounter (Signed)
F/u    Pt calling concerning holter results. Please call pt

## 2014-10-22 NOTE — Telephone Encounter (Signed)
I left a message for the patient to call. 

## 2014-10-25 NOTE — Telephone Encounter (Signed)
PT AWARE OF HOLTER  RESULTS IS ASKING IF  NEEDS FOLLOW UP  APPT  WILL  FORWARD MESSAGE   TO HEATHER TO  REVIEW  IF  NEEDS  AN APPT

## 2014-10-25 NOTE — Telephone Encounter (Signed)
Follow up     Calling to get holter results

## 2014-10-26 NOTE — Telephone Encounter (Signed)
Do you need to see her back based on most recent holter report?

## 2014-10-27 NOTE — Telephone Encounter (Signed)
Scant PVCs Remind benign and stress chocolate catechols etc can aggravate Can schedule tohave her come see renee in 6 months if she would like or prn

## 2014-10-28 NOTE — Telephone Encounter (Signed)
I left a message for the patient to call. 

## 2014-11-05 ENCOUNTER — Encounter: Payer: Self-pay | Admitting: *Deleted

## 2014-11-05 NOTE — Telephone Encounter (Signed)
Letter mailed. Recall placed for 6 months.

## 2015-02-28 ENCOUNTER — Other Ambulatory Visit: Payer: Self-pay | Admitting: Family Medicine

## 2015-06-10 ENCOUNTER — Other Ambulatory Visit: Payer: Self-pay | Admitting: Obstetrics and Gynecology

## 2015-06-14 LAB — CYTOLOGY - PAP

## 2015-07-13 ENCOUNTER — Other Ambulatory Visit: Payer: Self-pay | Admitting: Obstetrics and Gynecology

## 2015-07-13 DIAGNOSIS — R928 Other abnormal and inconclusive findings on diagnostic imaging of breast: Secondary | ICD-10-CM

## 2015-07-25 ENCOUNTER — Ambulatory Visit
Admission: RE | Admit: 2015-07-25 | Discharge: 2015-07-25 | Disposition: A | Discharge status: Home / Self Care | Payer: BLUE CROSS/BLUE SHIELD | Source: Ambulatory Visit | Attending: Obstetrics and Gynecology | Admitting: Obstetrics and Gynecology

## 2015-07-25 DIAGNOSIS — R928 Other abnormal and inconclusive findings on diagnostic imaging of breast: Secondary | ICD-10-CM

## 2015-08-23 ENCOUNTER — Other Ambulatory Visit: Payer: Self-pay | Admitting: Family Medicine

## 2015-08-24 ENCOUNTER — Encounter: Payer: Self-pay | Admitting: Family Medicine

## 2015-08-24 ENCOUNTER — Ambulatory Visit: Payer: BLUE CROSS/BLUE SHIELD | Admitting: Family Medicine

## 2015-08-24 ENCOUNTER — Ambulatory Visit (INDEPENDENT_AMBULATORY_CARE_PROVIDER_SITE_OTHER): Payer: BLUE CROSS/BLUE SHIELD | Admitting: Family Medicine

## 2015-08-24 VITALS — BP 136/79 | HR 72 | Temp 97.9°F | Ht 67.0 in | Wt 160.0 lb

## 2015-08-24 DIAGNOSIS — F411 Generalized anxiety disorder: Secondary | ICD-10-CM

## 2015-08-24 DIAGNOSIS — G43909 Migraine, unspecified, not intractable, without status migrainosus: Secondary | ICD-10-CM | POA: Insufficient documentation

## 2015-08-24 DIAGNOSIS — I499 Cardiac arrhythmia, unspecified: Secondary | ICD-10-CM

## 2015-08-24 DIAGNOSIS — I493 Ventricular premature depolarization: Secondary | ICD-10-CM

## 2015-08-24 MED ORDER — SUMATRIPTAN SUCCINATE 100 MG PO TABS: ORAL_TABLET | ORAL | 3 refills | Status: DC

## 2015-08-24 MED ORDER — CITALOPRAM HYDROBROMIDE 20 MG PO TABS: 20.0000 mg | ORAL_TABLET | Freq: Every day | ORAL | 3 refills | Status: DC

## 2015-08-24 NOTE — Progress Notes (Signed)
   Subjective:    Patient ID: Chloe Conley, female    DOB: [birth year], 45 y.o.   MRN: [id number]  HPI Here to follow up. She has been doing well. Her anxiety is well controlled. She has not used a Xanax in over a year, and she sleeps well. Her migraines are less frequent and respond well to Imitrex. She denies any rapid or irregular heartbeats.    Review of Systems  Constitutional: Negative.   Respiratory: Negative.   Cardiovascular: Negative.   Gastrointestinal: Negative.   Neurological: Positive for headaches. Negative for dizziness, tremors, seizures, syncope, facial asymmetry, speech difficulty, weakness, light-headedness and numbness.  Psychiatric/Behavioral: Negative.        Objective:   Physical Exam  Constitutional: She is oriented to person, place, and time. She appears well-developed and well-nourished.  Neck: No thyromegaly present.  Cardiovascular: Normal rate, regular rhythm, normal heart sounds and intact distal pulses.   Pulmonary/Chest: Effort normal and breath sounds normal.  Lymphadenopathy:    She has no cervical adenopathy.  Neurological: She is alert and oriented to person, place, and time.  Psychiatric: She has a normal mood and affect. Her behavior is normal. Judgment and thought content normal.          Assessment & Plan:  Her anxiety and migraines are well controlled. The Celexa and Imitrex, respectively, were refilled. The PVCs seem to have gone away. She will set up labs soon

## 2015-08-24 NOTE — Progress Notes (Signed)
Pre visit review using our clinic review tool, if applicable. No additional management support is needed unless otherwise documented below in the visit note. 

## 2016-05-17 ENCOUNTER — Other Ambulatory Visit: Payer: Self-pay | Admitting: Family Medicine

## 2016-06-22 ENCOUNTER — Ambulatory Visit (INDEPENDENT_AMBULATORY_CARE_PROVIDER_SITE_OTHER): Payer: BLUE CROSS/BLUE SHIELD | Admitting: Family Medicine

## 2016-06-22 ENCOUNTER — Encounter: Payer: Self-pay | Admitting: Family Medicine

## 2016-06-22 VITALS — BP 130/80 | HR 57 | Temp 98.4°F | Ht 67.0 in | Wt 173.0 lb

## 2016-06-22 DIAGNOSIS — Z0000 Encounter for general adult medical examination without abnormal findings: Secondary | ICD-10-CM | POA: Diagnosis not present

## 2016-06-22 LAB — LIPID PANEL
CHOLESTEROL: 206 mg/dL — AB (ref 0–200)
HDL: 54.9 mg/dL (ref >39.00)
LDL CALC: 131 mg/dL — AB (ref 0–99)
NonHDL: 151.49
TRIGLYCERIDES: 100 mg/dL (ref 0.0–149.0)
Total CHOL/HDL Ratio: 4
VLDL: 20 mg/dL (ref 0.0–40.0)

## 2016-06-22 LAB — CBC WITH DIFFERENTIAL/PLATELET
BASOS PCT: 1.2 % (ref 0.0–3.0)
Basophils Absolute: 0.1 10*3/uL (ref 0.0–0.1)
EOS ABS: 0.1 10*3/uL (ref 0.0–0.7)
Eosinophils Relative: 1.9 % (ref 0.0–5.0)
HEMATOCRIT: 40.6 % (ref 36.0–46.0)
Hemoglobin: 13.8 g/dL (ref 12.0–15.0)
LYMPHS ABS: 2.5 10*3/uL (ref 0.7–4.0)
LYMPHS PCT: 37.3 % (ref 12.0–46.0)
MCHC: 34 g/dL (ref 30.0–36.0)
MCV: 91 fl (ref 78.0–100.0)
MONOS PCT: 8.7 % (ref 3.0–12.0)
Monocytes Absolute: 0.6 10*3/uL (ref 0.1–1.0)
NEUTROS ABS: 3.4 10*3/uL (ref 1.4–7.7)
NEUTROS PCT: 50.9 % (ref 43.0–77.0)
PLATELETS: 281 10*3/uL (ref 150.0–400.0)
RBC: 4.46 Mil/uL (ref 3.87–5.11)
RDW: 12.8 % (ref 11.5–15.5)
WBC: 6.7 10*3/uL (ref 4.0–10.5)

## 2016-06-22 LAB — HEPATIC FUNCTION PANEL
ALBUMIN: 4.4 g/dL (ref 3.5–5.2)
ALK PHOS: 53 U/L (ref 39–117)
ALT: 11 U/L (ref 0–35)
AST: 11 U/L (ref 0–37)
Bilirubin, Direct: 0.1 mg/dL (ref 0.0–0.3)
Total Bilirubin: 0.4 mg/dL (ref 0.2–1.2)
Total Protein: 7.1 g/dL (ref 6.0–8.3)

## 2016-06-22 LAB — BASIC METABOLIC PANEL
BUN: 15 mg/dL (ref 6–23)
CHLORIDE: 104 meq/L (ref 96–112)
CO2: 29 meq/L (ref 19–32)
Calcium: 9.6 mg/dL (ref 8.4–10.5)
Creatinine, Ser: 0.87 mg/dL (ref 0.40–1.20)
GFR: 73.9 mL/min (ref >60.00)
GLUCOSE: 86 mg/dL (ref 70–99)
POTASSIUM: 4.2 meq/L (ref 3.5–5.1)
SODIUM: 140 meq/L (ref 135–145)

## 2016-06-22 LAB — POC URINALSYSI DIPSTICK (AUTOMATED)
BILIRUBIN UA: NEGATIVE
GLUCOSE UA: NEGATIVE
KETONES UA: NEGATIVE
Leukocytes, UA: NEGATIVE
NITRITE UA: NEGATIVE
PH UA: 6 (ref 5.0–8.0)
Protein, UA: NEGATIVE
RBC UA: NEGATIVE
Spec Grav, UA: >=1.03 — AB (ref 1.010–1.025)
Urobilinogen, UA: 0.2 E.U./dL

## 2016-06-22 LAB — TSH: TSH: 0.72 u[IU]/mL (ref 0.35–4.50)

## 2016-06-22 MED ORDER — CITALOPRAM HYDROBROMIDE 40 MG PO TABS: 40.0000 mg | ORAL_TABLET | Freq: Every day | ORAL | 3 refills | Status: DC

## 2016-06-22 NOTE — Progress Notes (Signed)
   Subjective:    Patient ID: Chloe Conley, female    DOB: Sep 18, 1970, 46 y.o.   MRN: [id number]  HPI 46 yr old female for a well exam. She feels well but she does ask if the dose of her Celexa could be increased. She has been feeling a little bit more anxiety lately.    Review of Systems  Constitutional: Negative.   HENT: Negative.   Eyes: Negative.   Respiratory: Negative.   Cardiovascular: Negative.   Gastrointestinal: Negative.   Genitourinary: Negative for decreased urine volume, difficulty urinating, dyspareunia, dysuria, enuresis, flank pain, frequency, hematuria, pelvic pain and urgency.  Musculoskeletal: Negative.   Skin: Negative.   Neurological: Negative.   Psychiatric/Behavioral: Negative for agitation, behavioral problems, confusion, decreased concentration, dysphoric mood, hallucinations, self-injury, sleep disturbance and suicidal ideas. The patient is nervous/anxious. The patient is not hyperactive.        Objective:   Physical Exam  Constitutional: She is oriented to person, place, and time. She appears well-developed and well-nourished. No distress.  HENT:  Head: Normocephalic and atraumatic.  Right Ear: External ear normal.  Left Ear: External ear normal.  Nose: Nose normal.  Mouth/Throat: Oropharynx is clear and moist. No oropharyngeal exudate.  Eyes: Conjunctivae and EOM are normal. Pupils are equal, round, and reactive to light. No scleral icterus.  Neck: Normal range of motion. Neck supple. No JVD present. No thyromegaly present.  Cardiovascular: Normal rate, regular rhythm, normal heart sounds and intact distal pulses.  Exam reveals no gallop and no friction rub.   No murmur heard. Pulmonary/Chest: Effort normal and breath sounds normal. No respiratory distress. She has no wheezes. She has no rales. She exhibits no tenderness.  Abdominal: Soft. Bowel sounds are normal. She exhibits no distension and no mass. There is no tenderness. There is no rebound and no  guarding.  Musculoskeletal: Normal range of motion. She exhibits no edema or tenderness.  Lymphadenopathy:    She has no cervical adenopathy.  Neurological: She is alert and oriented to person, place, and time. She has normal reflexes. No cranial nerve deficit. She exhibits normal muscle tone. Coordination normal.  Skin: Skin is warm and dry. No rash noted. No erythema.  Psychiatric: She has a normal mood and affect. Her behavior is normal. Judgment and thought content normal.          Assessment & Plan:  Well exam. We discussed diet and exercise. Get fasting labs. Increase Celexa to 40 mg daily.  Gershon CraneStephen Kimmy Parish, MD

## 2016-06-22 NOTE — Patient Instructions (Signed)
WE NOW OFFER   [facility] FAST TRACK!!!  SAME DAY Appointments for ACUTE CARE  Such as: Sprains, Injuries, cuts, abrasions, rashes, muscle pain, joint pain, back pain Colds, flu, sore throats, headache, allergies, cough, fever  Ear pain, sinus and eye infections Abdominal pain, nausea, vomiting, diarrhea, upset stomach Animal/insect bites  3 Easy Ways to Schedule: Walk-In Scheduling Call in scheduling Mychart Sign-up: [email or web address]         

## 2016-07-09 ENCOUNTER — Other Ambulatory Visit: Payer: Self-pay | Admitting: Obstetrics and Gynecology

## 2016-07-09 DIAGNOSIS — Z1231 Encounter for screening mammogram for malignant neoplasm of breast: Secondary | ICD-10-CM

## 2016-07-16 ENCOUNTER — Encounter: Payer: Self-pay | Admitting: Family Medicine

## 2016-07-27 ENCOUNTER — Ambulatory Visit: Payer: BLUE CROSS/BLUE SHIELD

## 2016-07-27 ENCOUNTER — Ambulatory Visit
Admission: RE | Admit: 2016-07-27 | Discharge: 2016-07-27 | Disposition: A | Discharge status: Home / Self Care | Payer: BLUE CROSS/BLUE SHIELD | Source: Ambulatory Visit | Attending: Obstetrics and Gynecology | Admitting: Obstetrics and Gynecology

## 2016-07-27 DIAGNOSIS — Z1231 Encounter for screening mammogram for malignant neoplasm of breast: Secondary | ICD-10-CM

## 2016-12-11 ENCOUNTER — Ambulatory Visit: Payer: BLUE CROSS/BLUE SHIELD | Admitting: Family Medicine

## 2016-12-12 ENCOUNTER — Telehealth: Payer: Self-pay | Admitting: Family Medicine

## 2016-12-12 NOTE — Telephone Encounter (Signed)
Copied from CRM [id number]. Topic: Quick Communication - See Telephone Encounter >> Dec 12, 2016 10:31 AM Oneal GroutSebastian, Jennifer S wrote: CRM for notification. See Telephone encounter for: Requesting refill on xanax, patient is going to be traveling and [facility] a small plane. [facility] Please advise  12/12/16.

## 2016-12-12 NOTE — Telephone Encounter (Signed)
Request for controlled substance 

## 2016-12-13 MED ORDER — ALPRAZOLAM ER 0.5 MG PO TB24: 0.5000 mg | ORAL_TABLET | Freq: Two times a day (BID) | ORAL | 5 refills | Status: DC | PRN

## 2016-12-13 NOTE — Telephone Encounter (Signed)
Rx was called in 

## 2016-12-13 NOTE — Telephone Encounter (Signed)
Sent to PCP for approval.  

## 2016-12-13 NOTE — Telephone Encounter (Signed)
Call in Xanax 0.5 mg to take bid prn anixety, #60 with 5 rf

## 2017-01-25 ENCOUNTER — Telehealth: Payer: Self-pay | Admitting: Family Medicine

## 2017-01-25 MED ORDER — SUMATRIPTAN SUCCINATE 100 MG PO TABS: ORAL_TABLET | ORAL | 3 refills | Status: DC

## 2017-01-25 NOTE — Telephone Encounter (Signed)
Call in #30 with 3 rf 

## 2017-01-25 NOTE — Telephone Encounter (Signed)
Copied from CRM [phone]. Topic: Quick Communication - Rx Refill/Question >> Jan 25, 2017  1:28 PM Percival SpanishKennedy, Cheryl W wrote: Medication SUMAtriptan (IMITREX) 100 MG tablet   Has the patient contacted their pharmacy  yes    [facility]    Preferred Pharmacy (with phone number or street name): ***   Agent: Please be advised that RX refills may take up to 3 business days. We ask that you follow-up with your pharmacy.

## 2017-01-25 NOTE — Telephone Encounter (Signed)
Last OV 06/22/2016. Rx was last refilled 05/17/2016 disp 30 with 2 refills.   Sent to PCP for approval.

## 2017-02-11 ENCOUNTER — Telehealth: Payer: Self-pay | Admitting: Family Medicine

## 2017-02-11 NOTE — Telephone Encounter (Signed)
Copied from CRM [id number]. Topic: Quick Communication - See Telephone Encounter >> Feb 11, 2017 12:15 PM Waymon AmatoBurton, Donna F wrote: CRM for notification. See Telephone encounter for:  Pt is requesting a refill on her imitrex she is completely out and does not like to be with out because she has been having headaches lately took her last one today  best number [phone] [facility]    02/11/17.

## 2017-02-11 NOTE — Telephone Encounter (Signed)
Left message for pt to contact pharmacy. Refills sent to pharmacy on 01/25/17.

## 2017-02-13 ENCOUNTER — Other Ambulatory Visit: Payer: Self-pay | Admitting: Family Medicine

## 2017-02-13 NOTE — Telephone Encounter (Signed)
Last OV 06/22/2016  Rx was last refilled 01/25/2017 disp 30 with 3 refills

## 2017-02-13 NOTE — Telephone Encounter (Signed)
Pharmacy states [facility] are expired[facility] Please advise. Patient requesting call back

## 2017-02-13 NOTE — Telephone Encounter (Signed)
I [facility].

## 2017-04-06 ENCOUNTER — Other Ambulatory Visit: Payer: Self-pay | Admitting: Family Medicine

## 2017-04-08 NOTE — Telephone Encounter (Signed)
Last OV 06/22/2016  Last refilled 01/25/2017 disp 30 with 3 refills   Sent to PCP for approval.

## 2017-07-04 ENCOUNTER — Other Ambulatory Visit: Payer: Self-pay | Admitting: Obstetrics and Gynecology

## 2017-07-04 DIAGNOSIS — Z1231 Encounter for screening mammogram for malignant neoplasm of breast: Secondary | ICD-10-CM

## 2017-08-19 ENCOUNTER — Other Ambulatory Visit: Payer: Self-pay | Admitting: Family Medicine

## 2017-08-19 NOTE — Telephone Encounter (Signed)
Last fill 08/12/16 Last OV 06/22/16  Ok to fill

## 2017-08-20 NOTE — Telephone Encounter (Signed)
Rx has been faxed in.  

## 2017-08-20 NOTE — Telephone Encounter (Signed)
Call in #60 with 5 rf 

## 2017-08-23 ENCOUNTER — Ambulatory Visit: Payer: BLUE CROSS/BLUE SHIELD

## 2017-09-13 ENCOUNTER — Ambulatory Visit: Payer: BLUE CROSS/BLUE SHIELD

## 2017-09-13 ENCOUNTER — Ambulatory Visit
Admission: RE | Admit: 2017-09-13 | Discharge: 2017-09-13 | Disposition: A | Discharge status: Home / Self Care | Payer: BLUE CROSS/BLUE SHIELD | Source: Ambulatory Visit | Attending: Obstetrics and Gynecology | Admitting: Obstetrics and Gynecology

## 2017-09-13 DIAGNOSIS — Z1231 Encounter for screening mammogram for malignant neoplasm of breast: Secondary | ICD-10-CM

## 2017-10-02 ENCOUNTER — Other Ambulatory Visit: Payer: Self-pay | Admitting: Family Medicine

## 2017-10-11 ENCOUNTER — Ambulatory Visit (INDEPENDENT_AMBULATORY_CARE_PROVIDER_SITE_OTHER): Payer: BLUE CROSS/BLUE SHIELD | Admitting: Family Medicine

## 2017-10-11 ENCOUNTER — Encounter: Payer: Self-pay | Admitting: Family Medicine

## 2017-10-11 VITALS — BP 110/72 | HR 65 | Temp 98.2°F | Wt 180.0 lb

## 2017-10-11 DIAGNOSIS — R635 Abnormal weight gain: Secondary | ICD-10-CM

## 2017-10-11 DIAGNOSIS — F411 Generalized anxiety disorder: Secondary | ICD-10-CM

## 2017-10-11 DIAGNOSIS — Z23 Encounter for immunization: Secondary | ICD-10-CM | POA: Diagnosis not present

## 2017-10-11 MED ORDER — PHENTERMINE HCL 37.5 MG PO CAPS: 37.5000 mg | ORAL_CAPSULE | Freq: Every day | ORAL | 1 refills | Status: DC

## 2017-10-11 MED ORDER — CITALOPRAM HYDROBROMIDE 40 MG PO TABS: 40.0000 mg | ORAL_TABLET | Freq: Every day | ORAL | 3 refills | Status: DC

## 2017-10-11 NOTE — Addendum Note (Signed)
Addended by: Marcellus Scott on: 10/11/2017 11:10 AM   Modules accepted: Orders

## 2017-10-11 NOTE — Progress Notes (Signed)
   Subjective:    Patient ID: Chloe Conley, female    DOB: 09-05-1970, 47 y.o.   MRN: [id number]  HPI Here to follow up. She feels great and wants to stay on Celexa. She is dieting and exercising but still has trouble losing weight.    Review of Systems  Constitutional: Negative.   Respiratory: Negative.   Cardiovascular: Negative.   Neurological: Negative.   Psychiatric/Behavioral: Negative.        Objective:   Physical Exam  Constitutional: She is oriented to person, place, and time. She appears well-developed and well-nourished.  Cardiovascular: Normal rate, regular rhythm, normal heart sounds and intact distal pulses.  Pulmonary/Chest: Effort normal and breath sounds normal. No stridor. No respiratory distress. She has no wheezes. She has no rales.  Neurological: She is alert and oriented to person, place, and time.  Psychiatric: She has a normal mood and affect. Her behavior is normal. Thought content normal.          Assessment & Plan:  Her anxiety is stable. Refilled Phentermine to assist with weight loss. Gershon Crane, MD

## 2018-01-19 ENCOUNTER — Other Ambulatory Visit: Payer: Self-pay | Admitting: Family Medicine

## 2018-01-22 NOTE — Telephone Encounter (Signed)
Pt asking for refill today please.

## 2018-01-23 MED ORDER — SUMATRIPTAN SUCCINATE 100 MG PO TABS: ORAL_TABLET | ORAL | 11 refills | Status: DC

## 2018-01-23 NOTE — Addendum Note (Signed)
Addended by: Gershon Crane A on: 01/23/2018 02:35 PM   Modules accepted: Orders

## 2018-01-23 NOTE — Telephone Encounter (Signed)
Done

## 2018-08-08 ENCOUNTER — Other Ambulatory Visit: Payer: Self-pay | Admitting: Family Medicine

## 2018-10-23 ENCOUNTER — Other Ambulatory Visit: Payer: Self-pay | Admitting: Obstetrics and Gynecology

## 2018-10-23 DIAGNOSIS — Z1231 Encounter for screening mammogram for malignant neoplasm of breast: Secondary | ICD-10-CM

## 2018-11-07 ENCOUNTER — Other Ambulatory Visit: Payer: Self-pay | Admitting: Family Medicine

## 2018-11-07 NOTE — Telephone Encounter (Signed)
Patient need to schedule an ov for more refills. 

## 2018-11-09 ENCOUNTER — Other Ambulatory Visit: Payer: Self-pay | Admitting: Family Medicine

## 2018-11-10 NOTE — Telephone Encounter (Signed)
Okay for refill?   Pt last OV 09/2017

## 2018-12-05 ENCOUNTER — Other Ambulatory Visit: Payer: Self-pay

## 2018-12-05 ENCOUNTER — Ambulatory Visit
Admission: RE | Admit: 2018-12-05 | Discharge: 2018-12-05 | Disposition: A | Discharge status: Home / Self Care | Payer: BC Managed Care – PPO | Source: Ambulatory Visit | Attending: Obstetrics and Gynecology | Admitting: Obstetrics and Gynecology

## 2018-12-05 DIAGNOSIS — Z1231 Encounter for screening mammogram for malignant neoplasm of breast: Secondary | ICD-10-CM

## 2018-12-10 ENCOUNTER — Ambulatory Visit (HOSPITAL_COMMUNITY)
Admission: EM | Admit: 2018-12-10 | Discharge: 2018-12-10 | Disposition: A | Discharge status: Home / Self Care | Payer: BC Managed Care – PPO

## 2018-12-10 ENCOUNTER — Other Ambulatory Visit: Payer: Self-pay

## 2018-12-10 ENCOUNTER — Observation Stay (HOSPITAL_BASED_OUTPATIENT_CLINIC_OR_DEPARTMENT_OTHER)
Admission: EM | Admit: 2018-12-10 | Discharge: 2018-12-12 | DRG: 419 | Disposition: A | Discharge status: Home / Self Care | Payer: BC Managed Care – PPO | Attending: Surgery | Admitting: Surgery

## 2018-12-10 ENCOUNTER — Encounter (HOSPITAL_BASED_OUTPATIENT_CLINIC_OR_DEPARTMENT_OTHER): Payer: Self-pay | Admitting: *Deleted

## 2018-12-10 ENCOUNTER — Emergency Department (HOSPITAL_BASED_OUTPATIENT_CLINIC_OR_DEPARTMENT_OTHER): Payer: BC Managed Care – PPO

## 2018-12-10 DIAGNOSIS — K810 Acute cholecystitis: Secondary | ICD-10-CM | POA: Diagnosis not present

## 2018-12-10 DIAGNOSIS — G43909 Migraine, unspecified, not intractable, without status migrainosus: Secondary | ICD-10-CM | POA: Diagnosis not present

## 2018-12-10 DIAGNOSIS — Z20828 Contact with and (suspected) exposure to other viral communicable diseases: Secondary | ICD-10-CM | POA: Diagnosis not present

## 2018-12-10 DIAGNOSIS — Z79899 Other long term (current) drug therapy: Secondary | ICD-10-CM

## 2018-12-10 DIAGNOSIS — F411 Generalized anxiety disorder: Secondary | ICD-10-CM | POA: Diagnosis present

## 2018-12-10 DIAGNOSIS — R319 Hematuria, unspecified: Secondary | ICD-10-CM | POA: Diagnosis present

## 2018-12-10 DIAGNOSIS — K8020 Calculus of gallbladder without cholecystitis without obstruction: Secondary | ICD-10-CM

## 2018-12-10 DIAGNOSIS — K8000 Calculus of gallbladder with acute cholecystitis without obstruction: Secondary | ICD-10-CM | POA: Diagnosis not present

## 2018-12-10 DIAGNOSIS — F419 Anxiety disorder, unspecified: Secondary | ICD-10-CM | POA: Insufficient documentation

## 2018-12-10 DIAGNOSIS — R1011 Right upper quadrant pain: Secondary | ICD-10-CM

## 2018-12-10 DIAGNOSIS — K589 Irritable bowel syndrome without diarrhea: Secondary | ICD-10-CM | POA: Diagnosis present

## 2018-12-10 DIAGNOSIS — K8012 Calculus of gallbladder with acute and chronic cholecystitis without obstruction: Secondary | ICD-10-CM | POA: Diagnosis not present

## 2018-12-10 DIAGNOSIS — J309 Allergic rhinitis, unspecified: Secondary | ICD-10-CM | POA: Diagnosis present

## 2018-12-10 DIAGNOSIS — R109 Unspecified abdominal pain: Secondary | ICD-10-CM | POA: Diagnosis present

## 2018-12-10 LAB — URINALYSIS, ROUTINE W REFLEX MICROSCOPIC
Bilirubin Urine: NEGATIVE
Glucose, UA: NEGATIVE mg/dL
Ketones, ur: NEGATIVE mg/dL
Leukocytes,Ua: NEGATIVE
Nitrite: NEGATIVE
Protein, ur: 30 mg/dL — AB
Specific Gravity, Urine: >1.03 — ABNORMAL HIGH (ref 1.005–1.030)
pH: 7 (ref 5.0–8.0)

## 2018-12-10 LAB — CBC WITH DIFFERENTIAL/PLATELET
Abs Immature Granulocytes: 0.04 10*3/uL (ref 0.00–0.07)
Basophils Absolute: 0.1 10*3/uL (ref 0.0–0.1)
Basophils Relative: 1 %
Eosinophils Absolute: 0 10*3/uL (ref 0.0–0.5)
Eosinophils Relative: 0 %
HCT: 44.1 % (ref 36.0–46.0)
Hemoglobin: 14.7 g/dL (ref 12.0–15.0)
Immature Granulocytes: 0 %
Lymphocytes Relative: 20 %
Lymphs Abs: 2 10*3/uL (ref 0.7–4.0)
MCH: 30.6 pg (ref 26.0–34.0)
MCHC: 33.3 g/dL (ref 30.0–36.0)
MCV: 91.7 fL (ref 80.0–100.0)
Monocytes Absolute: 0.5 10*3/uL (ref 0.1–1.0)
Monocytes Relative: 5 %
Neutro Abs: 7.5 10*3/uL (ref 1.7–7.7)
Neutrophils Relative %: 74 %
Platelets: 313 10*3/uL (ref 150–400)
RBC: 4.81 MIL/uL (ref 3.87–5.11)
RDW: 11.6 % (ref 11.5–15.5)
WBC: 10.2 10*3/uL (ref 4.0–10.5)
nRBC: 0 % (ref 0.0–0.2)

## 2018-12-10 LAB — COMPREHENSIVE METABOLIC PANEL
ALT: 19 U/L (ref 0–44)
AST: 18 U/L (ref 15–41)
Albumin: 4.6 g/dL (ref 3.5–5.0)
Alkaline Phosphatase: 73 U/L (ref 38–126)
Anion gap: 9 (ref 5–15)
BUN: 12 mg/dL (ref 6–20)
CO2: 27 mmol/L (ref 22–32)
Calcium: 10.6 mg/dL — ABNORMAL HIGH (ref 8.9–10.3)
Chloride: 99 mmol/L (ref 98–111)
Creatinine, Ser: 0.95 mg/dL (ref 0.44–1.00)
GFR calc Af Amer: >60 mL/min (ref >60)
GFR calc non Af Amer: >60 mL/min (ref >60)
Glucose, Bld: 109 mg/dL — ABNORMAL HIGH (ref 70–99)
Potassium: 3.7 mmol/L (ref 3.5–5.1)
Sodium: 135 mmol/L (ref 135–145)
Total Bilirubin: 0.6 mg/dL (ref 0.3–1.2)
Total Protein: 8.1 g/dL (ref 6.5–8.1)

## 2018-12-10 LAB — URINALYSIS, MICROSCOPIC (REFLEX): WBC, UA: NONE SEEN WBC/hpf (ref 0–5)

## 2018-12-10 LAB — PREGNANCY, URINE: Preg Test, Ur: NEGATIVE

## 2018-12-10 LAB — TROPONIN I (HIGH SENSITIVITY): Troponin I (High Sensitivity): <2 ng/L (ref <18)

## 2018-12-10 LAB — LIPASE, BLOOD: Lipase: 51 U/L (ref 11–51)

## 2018-12-10 MED ORDER — MORPHINE SULFATE (PF) 4 MG/ML IV SOLN
4.0000 mg | Freq: Once | INTRAVENOUS | Status: AC
  Administered 2018-12-10: 23:00:00 4 mg via INTRAVENOUS
  Filled 2018-12-10: qty 1

## 2018-12-10 MED ORDER — ONDANSETRON HCL 4 MG/2ML IJ SOLN
4.0000 mg | Freq: Once | INTRAMUSCULAR | Status: AC
  Administered 2018-12-10: 23:00:00 4 mg via INTRAVENOUS
  Filled 2018-12-10: qty 2

## 2018-12-10 MED ORDER — MORPHINE SULFATE (PF) 4 MG/ML IV SOLN
4.0000 mg | Freq: Once | INTRAVENOUS | Status: AC
  Administered 2018-12-10: 4 mg via INTRAVENOUS
  Filled 2018-12-10: qty 1

## 2018-12-10 MED ORDER — FAMOTIDINE IN NACL 20-0.9 MG/50ML-% IV SOLN
20.0000 mg | Freq: Once | INTRAVENOUS | Status: AC
  Administered 2018-12-10: 20 mg via INTRAVENOUS
  Filled 2018-12-10: qty 50

## 2018-12-10 MED ORDER — SODIUM CHLORIDE 0.9 % IV BOLUS
500.0000 mL | Freq: Once | INTRAVENOUS | Status: AC
  Administered 2018-12-10: 500 mL via INTRAVENOUS

## 2018-12-10 MED ORDER — OXYCODONE-ACETAMINOPHEN 5-325 MG PO TABS
1.0000 | ORAL_TABLET | Freq: Once | ORAL | Status: AC
  Administered 2018-12-10: 21:00:00 1 via ORAL
  Filled 2018-12-10: qty 1

## 2018-12-10 MED ORDER — ONDANSETRON HCL 4 MG/2ML IJ SOLN
4.0000 mg | Freq: Once | INTRAMUSCULAR | Status: AC
  Administered 2018-12-10: 4 mg via INTRAVENOUS
  Filled 2018-12-10: qty 2

## 2018-12-10 MED ORDER — SODIUM CHLORIDE 0.9 % IV SOLN
2.0000 g | Freq: Once | INTRAVENOUS | Status: AC
  Administered 2018-12-10: 21:00:00 2 g via INTRAVENOUS
  Filled 2018-12-10: qty 20

## 2018-12-10 NOTE — Discharge Instructions (Addendum)
Go directly to the [facility] emergency department for further evaluation. Do not eat or drink anything on the way to the emergency room.

## 2018-12-10 NOTE — H&P (Signed)
Chloe Conley  09-06-1970 [id number]  CARE TEAM:  PCP: Nelwyn Salisbury, MD  Outpatient Care Team: Patient Care Team: Nelwyn Salisbury, MD as PCP - Gwynneth Munson, MD as Consulting Physician (Gastroenterology) Duke Salvia, MD as Consulting Physician (Cardiology)  Inpatient Treatment Team: Treatment Team: Attending Provider: Raeford Razor, MD; Registered Nurse: Shea Stakes, RN; Physician Assistant: Rayne Du; Consulting Physician: Montez Morita, Md, MD   This patient is a 48 y.o.female who presents today for surgical evaluation at the request of Dr Juleen China, [facility] ED.   Chief complaint / Reason for evaluation: Epigastric pain with gallstones.  Probable cholecystitis  Woman who awoke with epigastric and lower chest pain.  Persistent.  Became more intense.  Worse in the upper abdomen.  Worse right greater than left.  Felt some radiation to her back as well.  Decreased appetite and bloating.  Had some nausea but not to the point of emesis.  Never had anything like this before.  Based concerns went to urgent care center.  Recommendation made to go to emergency room for evaluation.  Went to [facility] emergency department.  Negative cardiac or pulmonary work-up initially concern for biliary colic since seemed to improve with narcotics.  Underwent ultrasound that showed possible cholecystitis with Murphy sign.  Patient's pain worsened requiring repeat narcotics and has not abated all day.  Therefore, surgical consultation requested.  Patient has history of anxiety.  She has had some right-sided tachycardia and PVCs eval by cardiology 5 years ago that was underwhelming.  She has a history of irregular bowels and questionable IBS.  Normal colonoscopy 2014.  No major changes.  No personal nor family history of GI/colon cancer, inflammatory bowel disease, irritable bowel syndrome, allergy such as Celiac Sprue, dietary/dairy problems, colitis, ulcers nor gastritis.  No recent  sick contacts/gastroenteritis.  No travel outside the country.  No changes in diet.  No dysphagia to solids or liquids.  No significant heartburn or reflux.  No hematochezia, hematemesis, coffee ground emesis.  No evidence of prior gastric/peptic ulceration.  She normally can walk 1/2-hour without difficulty.  She has had diagnostic laparoscopy surgery for fertility and had a C-section.  No other abdominal surgeries    Assessment  Chloe Conley  48 y.o. female       Problem List:  Principal Problem:   Acute calculous cholecystitis Active Problems:   Anxiety state   Irritable bowel syndrome   Episodes epigastric pain suspicious for biliary colic with ultrasound showing gallstones and some inflammation and Murphy sign.  Recurrent pain suspicious for early cholecystitis  Plan:  Admission  IV antibiotics.  Laparoscopic cholecystectomy this admission.  The anatomy & physiology of hepatobiliary & pancreatic function was discussed.  The pathophysiology of gallbladder dysfunction was discussed.  Natural history risks without surgery was discussed.   I feel the risks of no intervention will lead to serious problems that outweigh the operative risks; therefore, I recommended cholecystectomy to remove the pathology.  I explained laparoscopic techniques with possible need for an open approach.  Probable cholangiogram to evaluate the bilary tract was explained as well.    Risks such as bleeding, infection, abscess, leak, injury to other organs, need for repair of tissues / organs, need for further treatment, stroke, heart attack, death, and other risks were discussed.  I noted a good likelihood this will help address the problem.  Possibility that this will not correct all abdominal symptoms was explained.  Goals of  post-operative recovery were discussed as well.  We will work to minimize complications.  An educational handout further explaining the pathology and treatment options was given as well.   Questions were answered.  The patient expresses understanding & wishes to proceed with surgery.  Anxiolysis -VTE prophylaxis- SCDs, etc -mobilize as tolerated to help recovery  30 minutes spent in review, evaluation, examination, counseling, and coordination of care.  More than 50% of that time was spent in counseling.  Ardeth SportsmanSteven C. Bao Bazen, MD, FACS, MASCRS Gastrointestinal and Minimally Invasive Surgery  [facility] [address] [place], [place] [address] [phone] [address] / Paging [phone] Fax     12/10/2018      Past Medical History:  Diagnosis Date   Allergy    Anxiety    Frequent PVCs    sees Dr. Sherryl MangesSteven Klein    Headache(784.0)    IBS (irritable bowel syndrome)    Routine gynecological examination    sees Dr. 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 Loreta AveMann, clear, repeat at age 48    DILATION AND EVACUATION  05/14/2003   laproscopy     REDUCTION MAMMAPLASTY Bilateral    bilat lift   RHINOPLASTY     septroplasty     vitro fertilization     x 3    Social History   Socioeconomic History   Marital status: Married    Spouse name: Not on file   Number of children: Not on file   Years of education: Not on file   Highest education level: Not on file  Occupational History   Not on file  Social Needs   Financial resource strain: Not on file   Food insecurity    Worry: Not on file    Inability: Not on file   Transportation needs    Medical: Not on file    Non-medical: Not on file  Tobacco Use   Smoking status: Never Smoker   Smokeless tobacco: Never Used  Substance and Sexual Activity   Alcohol use: No    Alcohol/week: 0.0 standard drinks   Drug use: No   Sexual activity: Not on file   Lifestyle   Physical activity    Days per week: Not on file    Minutes per session: Not on file   Stress: Not on file  Relationships   Social connections    Talks on phone: Not on file    Gets together: Not on file    Attends religious service: Not on file    Active member of club or organization: Not on file    Attends meetings of clubs or organizations: Not on file    Relationship status: Not on file   Intimate partner violence    Fear of current or ex partner: Not on file    Emotionally abused: Not on file    Physically abused: Not on file    Forced sexual activity: Not on file  Other Topics Concern   Not on file  Social History Narrative   Not on file    Family History  Problem Relation Age of Onset   Diabetes Other    Migraines Other     Current Facility-Administered Medications  Medication Dose  Route Frequency Provider Last Rate Last Dose   morphine 4 MG/ML injection 4 mg  4 mg Intravenous Once Couture, Cortni S, PA-C       Current Outpatient Medications  Medication Sig Dispense Refill   ALPRAZolam (XANAX) 0.5 MG tablet TAKE 1 TABLET BY MOUTH TWICE A DAY AS NEEDED FOR ANXIETY 60 tablet 5   citalopram (CELEXA) 40 MG tablet TAKE 1 TABLET BY MOUTH EVERY DAY 90 tablet 0   phentermine 37.5 MG capsule Take 1 capsule (37.5 mg total) by mouth daily. 90 capsule 1   SUMAtriptan (IMITREX) 100 MG tablet TAKE 1 TABLET BY MOUTH AS NEEDED FOR HEADACHE MAY REPEAT IN 2 HOURS IF NEEDED 15 tablet 6     No Known Allergies  ROS:   All other systems reviewed & are negative except per HPI or as noted below: Constitutional:  No fevers, chills, sweats.  Weight stable Eyes:  No vision changes, No discharge HENT:  No sore throats, nasal drainage Lymph: No neck swelling, No bruising easily Pulmonary:  No cough, productive sputum CV: No orthopnea, PND  Patient walks 30 minutes for about 2 miles without difficulty.  No exertional chest/neck/shoulder/arm pain. GI: No personal  nor family history of GI/colon cancer, inflammatory bowel disease, irritable bowel syndrome, allergy such as Celiac Sprue, dietary/dairy problems, colitis, ulcers nor gastritis.  No recent sick contacts/gastroenteritis.  No travel outside the country.  No changes in diet. Renal: No UTIs, No hematuria Genital:  No drainage, bleeding, masses Musculoskeletal: No severe joint pain.  Good ROM major joints Skin:  No sores or lesions.  No rashes Heme/Lymph:  No easy bleeding.  No swollen lymph nodes Neuro: No focal weakness/numbness.  No seizures Psych: No suicidal ideation.  No hallucinations  BP (!) 143/66    Pulse 68    Temp 98.6 F (37 C)    Resp 15    Ht  (1.676 m)    Wt 81.6 kg    LMP 11/13/2018    SpO2 100%    BMI 29.05 kg/m   Physical Exam: General: Pt awake/alert/oriented x4 in mild major acute distress Eyes: PERRL, normal EOM. Sclera nonicteric Neuro: CN II-XII intact w/o focal sensory/motor deficits. Lymph: No head/neck/groin lymphadenopathy Psych:  No delerium/psychosis/paranoia HENT: Normocephalic, Mucus membranes moist.  No thrush Neck: Supple, No tracheal deviation Chest: No pain.  Good respiratory excursion. CV:  Pulses intact.  Regular rhythm Abdomen: Epigastric and right upper quadrant tenderness with very mild Murphy sign.  The rest of the abdomen is soft, nondistended & Nontender.  No incarcerated hernias. Gen:  No inguinal hernias.  No inguinal lymphadenopathy.   Ext:  SCDs BLE.  No significant edema.  No cyanosis Skin: No petechiae / purpurea.  No major sores Musculoskeletal: No severe joint pain.  Good ROM major joints   Results:   Labs: Results for orders placed or performed during the hospital encounter of 12/10/18 (from the past 48 hour(s))  CBC with Differential     Status: None   Collection Time: 12/10/18  7:55 PM  Result Value Ref Range   WBC 10.2 4.0 - 10.5 K/uL   RBC 4.81 3.87 - 5.11 MIL/uL   Hemoglobin 14.7 12.0 - 15.0 g/dL   HCT 16.1 09.6 - 04.5  %   MCV 91.7 80.0 - 100.0 fL   MCH 30.6 26.0 - 34.0 pg   MCHC 33.3 30.0 - 36.0 g/dL   RDW 40.9 81.1 - 91.4 %   Platelets 313 150 - 400 K/uL  nRBC 0.0 0.0 - 0.2 %   Neutrophils Relative % 74 %   Neutro Abs 7.5 1.7 - 7.7 K/uL   Lymphocytes Relative 20 %   Lymphs Abs 2.0 0.7 - 4.0 K/uL   Monocytes Relative 5 %   Monocytes Absolute 0.5 0.1 - 1.0 K/uL   Eosinophils Relative 0 %   Eosinophils Absolute 0.0 0.0 - 0.5 K/uL   Basophils Relative 1 %   Basophils Absolute 0.1 0.0 - 0.1 K/uL   Immature Granulocytes 0 %   Abs Immature Granulocytes 0.04 0.00 - 0.07 K/uL    Comment: Performed at [facility], [address] [place], [place] [address]  Comprehensive metabolic panel     Status: Abnormal   Collection Time: 12/10/18  7:55 PM  Result Value Ref Range   Sodium 135 135 - 145 mmol/L   Potassium 3.7 3.5 - 5.1 mmol/L   Chloride 99 98 - 111 mmol/L   CO2 27 22 - 32 mmol/L   Glucose, Bld 109 (H) 70 - 99 mg/dL   BUN 12 6 - 20 mg/dL   Creatinine, Ser 0.95 0.44 - 1.00 mg/dL   Calcium 10.6 (H) 8.9 - 10.3 mg/dL   Total Protein 8.1 6.5 - 8.1 g/dL   Albumin 4.6 3.5 - 5.0 g/dL   AST 18 15 - 41 U/L   ALT 19 0 - 44 U/L   Alkaline Phosphatase 73 38 - 126 U/L   Total Bilirubin 0.6 0.3 - 1.2 mg/dL   GFR calc non Af Amer >60 >60 mL/min   GFR calc Af Amer >60 >60 mL/min   Anion gap 9 5 - 15    Comment: Performed at [facility], [address] [place], [place] [address]  Lipase, blood     Status: None   Collection Time: 12/10/18  7:55 PM  Result Value Ref Range   Lipase 51 11 - 51 U/L    Comment: Performed at [facility], [address] [place], [place] [address]  Urinalysis, Routine w reflex microscopic     Status: Abnormal   Collection Time: 12/10/18  7:55 PM  Result Value Ref Range   Color, Urine YELLOW YELLOW   APPearance CLEAR CLEAR   Specific Gravity, Urine >1.030 (H) 1.005 - 1.030   pH 7.0 5.0 - 8.0   Glucose, UA NEGATIVE NEGATIVE mg/dL   Hgb  urine dipstick SMALL (A) NEGATIVE   Bilirubin Urine NEGATIVE NEGATIVE   Ketones, ur NEGATIVE NEGATIVE mg/dL   Protein, ur 30 (A) NEGATIVE mg/dL   Nitrite NEGATIVE NEGATIVE   Leukocytes,Ua NEGATIVE NEGATIVE    Comment: Performed at [facility], [address] [place], [place] [address]  Pregnancy, urine     Status: None   Collection Time: 12/10/18  7:55 PM  Result Value Ref Range   Preg Test, Ur NEGATIVE NEGATIVE    Comment:        THE SENSITIVITY OF THIS METHODOLOGY IS >20 mIU/mL. Performed at [facility], [address] [place], [place] [address]   Troponin I (High Sensitivity)     Status: None   Collection Time: 12/10/18  7:55 PM  Result Value Ref Range   Troponin I (High Sensitivity) <2 <18 ng/L    Comment: (NOTE) Elevated high sensitivity troponin I (hsTnI) values and significant  changes across serial measurements may suggest ACS but many other  chronic and acute conditions are known to elevate hsTnI  results.  Refer to the Links section for chest pain algorithms and additional  guidance. Performed at [facility], [address] [place], [place] [address]   Urinalysis, Microscopic (reflex)     Status: Abnormal   Collection Time: 12/10/18  7:55 PM  Result Value Ref Range   RBC / HPF 6-10 0 - 5 RBC/hpf   WBC, UA NONE SEEN 0 - 5 WBC/hpf   Bacteria, UA RARE (A) NONE SEEN   Squamous Epithelial / LPF 6-10 0 - 5    Comment: Performed at [facility], [address] [place], [place] [address]    Imaging / Studies: Mm 3d Screen Breast Bilateral  Result Date: 12/05/2018 CLINICAL DATA:  Screening. EXAM: DIGITAL SCREENING BILATERAL MAMMOGRAM WITH TOMO AND CAD COMPARISON:  Previous exam(s). ACR Breast Density Category b: There are scattered areas of fibroglandular density. FINDINGS: There are no findings suspicious for malignancy. Images were processed with CAD. IMPRESSION: No mammographic evidence of malignancy. A result letter of  this screening mammogram will be mailed directly to the patient. RECOMMENDATION: Screening mammogram in one year. (Code:SM-B-01Y) BI-RADS CATEGORY  1: Negative. Electronically Signed   By: Baird Lyons M.D.   On: 12/05/2018 16:50   US Abdomen Limited Ruq  Result Date: 12/10/2018 CLINICAL DATA:  Right upper quadrant abdominal pain EXAM: ULTRASOUND ABDOMEN LIMITED RIGHT UPPER QUADRANT COMPARISON:  None. FINDINGS: Gallbladder: Multiple gallstones are noted. While there is no gallbladder wall thickening, the gallbladder is distended and the sonographic Eulah Pont sign is positive. There is no evidence for pericholecystic free fluid. Gallbladder sludge is noted. Common bile duct: Diameter: 5 mm. Liver: No focal lesion identified. Within normal limits in parenchymal echogenicity. Portal vein is patent on color Doppler imaging with normal direction of blood flow towards the liver. Other: None. IMPRESSION: Overall findings consistent with acute calculus cholecystitis in the appropriate clinical setting. Electronically Signed   By: Katherine Mantle M.D.   On: 12/10/2018 20:05    Medications / Allergies: per chart  Antibiotics: Anti-infectives (From admission, onward)   Start     Dose/Rate Route Frequency Ordered Stop   12/10/18 2045  cefTRIAXone (ROCEPHIN) 2 g in sodium chloride 0.9 % 100 mL IVPB     2 g 200 mL/hr over 30 Minutes Intravenous  Once 12/10/18 2036 12/10/18 2116        Note: Portions of this report may have been transcribed using voice recognition software. Every effort was made to ensure accuracy; however, inadvertent computerized transcription errors may be present.   Any transcriptional errors that result from this process are unintentional.    Ardeth Sportsman, MD, FACS, MASCRS Gastrointestinal and Minimally Invasive Surgery  [facility] [address] [place], [place] [address] [phone] Main / Paging [phone] Fax     12/10/2018

## 2018-12-10 NOTE — ED Provider Notes (Addendum)
Medical screening examination/treatment/procedure(s) were conducted as a shared visit with non-physician practitioner(s) and myself.  I personally evaluated the patient during the encounter.     48 year old female with abdominal pain.  Symptoms consistent with biliary colic.  Ultrasound as below.  Clinically I doubt that this is acute cholecystitis.  I suspect radiology read based more on Murphy's sign during her scanning.  She actually had what I consider to be pretty minimal tenderness on my exam.  She states that she feels much better after meds.  No gallbladder wall thickening.  No cholecystic fluid.  No leukocytosis.  LFTs normal.  Discussed with patient and her husband.  Plan continue symptomatic treatment. Diet discussed. Outpatient follow-up with surgery.  Return precautions discussed.  Pt's pain returning again. Says it feels just as bad as it was when she came in. Meds redosed and still not much better. Discussed with Dr Johney Maine, general surgery. Not admitting patient at this time but would like her transferred to the ED at [facility] and will see her there.   Mm 3d Screen Breast Bilateral  Result Date: 12/05/2018 CLINICAL DATA:  Screening. EXAM: DIGITAL SCREENING BILATERAL MAMMOGRAM WITH TOMO AND CAD COMPARISON:  Previous exam(s). ACR Breast Density Category b: There are scattered areas of fibroglandular density. FINDINGS: There are no findings suspicious for malignancy. Images were processed with CAD. IMPRESSION: No mammographic evidence of malignancy. A result letter of this screening mammogram will be mailed directly to the patient. RECOMMENDATION: Screening mammogram in one year. (Code:SM-B-01Y) BI-RADS CATEGORY  1: Negative. Electronically Signed   By: Lillia Mountain M.D.   On: 12/05/2018 16:50   US Abdomen Limited Ruq  Result Date: 12/10/2018 CLINICAL DATA:  Right upper quadrant abdominal pain EXAM: ULTRASOUND ABDOMEN LIMITED RIGHT UPPER QUADRANT COMPARISON:  None. FINDINGS: Gallbladder: Multiple  gallstones are noted. While there is no gallbladder wall thickening, the gallbladder is distended and the sonographic Percell Miller sign is positive. There is no evidence for pericholecystic free fluid. Gallbladder sludge is noted. Common bile duct: Diameter: 5 mm. Liver: No focal lesion identified. Within normal limits in parenchymal echogenicity. Portal vein is patent on color Doppler imaging with normal direction of blood flow towards the liver. Other: None. IMPRESSION: Overall findings consistent with acute calculus cholecystitis in the appropriate clinical setting. Electronically Signed   By: Constance Holster M.D.   On: 12/10/2018 20:05     Virgel Manifold, MD 12/10/18 4403    Virgel Manifold, MD 12/10/18 [phone]

## 2018-12-10 NOTE — ED Triage Notes (Signed)
Pt c/o epigastric abd pain and back pain x 8 hrs , seen at [facility] earlier for same EKG done

## 2018-12-10 NOTE — ED Provider Notes (Addendum)
[facility] EMERGENCY DEPARTMENT Provider Note   CSN: [id number] Arrival date & time: 12/10/18  1819     History   Chief Complaint Chief Complaint  Patient presents with  . Abdominal Pain    HPI Chloe Conley is a 48 y.o. female.      HPI   Pt is a 48 y/o F who presents to the ED today for eval of abd pain/chets pain. Pain located to the lower chest bilaterally and to the epigastrium. Pain has been constant since onset. Pain is severe in nature. She reports associated nausea, but denies vomiting. Denies sob, pleuritic pain, cough, diarrhea, constipation, or urinary sxs.   Has tried tylenol without relief. She was seen at urgent care earlier today and received rx for pepcid but this has also not resolved sxs.   Denies leg pain/swelling, hemoptysis, recent surgery/trauma, recent long travel, hormone use, personal hx of cancer, or hx of DVT/PE.    Past Medical History:  Diagnosis Date  . Allergy   . Anxiety   . Frequent PVCs    sees Dr. Sherryl Manges   . Headache(784.0)   . IBS (irritable bowel syndrome)   . Routine gynecological examination    sees Dr. Waynard Reeds  . RVOT ventricular tachycardia (HCC) 2015    Patient Active Problem List   Diagnosis Date Noted  . Acute calculous cholecystitis 12/10/2018  . Migraines 08/24/2015  . 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 Irregular heartbeat 02/03/2013  . RVOT ventricular tachycardia (HCC) 2015  . FATIGUE 03/31/2008  . Anxiety state 10/15/2007  . Irritable bowel syndrome 10/15/2007  . WEIGHT GAIN 03/31/2007  . ALLERGIC RHINITIS 10/11/2006  . Headache 10/11/2006    Past Surgical History:  Procedure Laterality Date  . BREAST ENHANCEMENT SURGERY    . CESAREAN SECTION  04/30/2005   Dr Edward Jolly  . CHOLECYSTECTOMY N/A 12/11/2018   Procedure: LAPAROSCOPIC CHOLECYSTECTOMY WITH INTRAOPERATIVE CHOLANGIOGRAM;  Surgeon: [name], MD;  Location: [facility]S;  Service: General;   Laterality: N/A;  . COLONOSCOPY  01/01/2013   per Dr. Loreta Ave, clear, repeat at age 17   . DIAGNOSTIC LAPAROSCOPY     For IVF/Fertility   . DILATION AND EVACUATION  05/14/2003  . REDUCTION MAMMAPLASTY Bilateral    bilat lift  . RHINOPLASTY    . SEPTOPLASTY       OB History   No obstetric history on file.      Home Medications    Prior to Admission medications   Medication Sig Start Date End Date Taking? Authorizing Provider  citalopram (CELEXA) 40 MG tablet TAKE 1 TABLET BY MOUTH EVERY DAY Patient taking differently: Take 40 mg by mouth daily.  08/08/18  Yes Nelwyn Salisbury, MD  Multiple Vitamin (MULTIVITAMIN WITH MINERALS) TABS tablet Take 1 tablet by mouth daily.   Yes [provider]  SUMAtriptan (IMITREX) 100 MG tablet TAKE 1 TABLET BY MOUTH AS NEEDED FOR HEADACHE MAY REPEAT IN 2 HOURS IF NEEDED Patient taking differently: Take 100 mg by mouth every 2 (two) hours as needed for migraine. MAY REPEAT IN 2 HOURS IF NEEDED 11/10/18  Yes Nelwyn Salisbury, MD  traMADol (ULTRAM) 50 MG tablet Take 1-2 tablets (50-100 mg total) by mouth every 6 (six) hours as needed. 12/12/18   [name], MD    Family History Family History  Problem Relation Age of Onset  . Diabetes Other   . Migraines Other     Social History Social  History   Tobacco Use  . Smoking status: Never Smoker  . Smokeless tobacco: Never Used  Substance Use Topics  . Alcohol use: No    Alcohol/week: 0.0 standard drinks  . Drug use: No     Allergies   Patient has no known allergies.   Review of Systems Review of Systems  Constitutional: Negative for fever.  HENT: Negative for ear pain and sore throat.   Eyes: Negative for visual disturbance.  Respiratory: Negative for cough and shortness of breath.   Cardiovascular: Positive for chest pain. Negative for leg swelling.  Gastrointestinal: Positive for abdominal pain and nausea. Negative for constipation, diarrhea and vomiting.  Genitourinary:  Negative for dysuria and hematuria.  Musculoskeletal: Negative for back pain.  Skin: Negative for rash.  Neurological: Negative for headaches.  All other systems reviewed and are negative.    Physical Exam Updated Vital Signs BP 125/71 (BP Location: Left Arm)   Pulse 69   Temp 98.2 F (36.8 C) (Oral)   Resp 15   Ht 5\' 6"  (1.676 m)   Wt 81.6 kg   LMP 11/13/2018   SpO2 100%   BMI 29.05 kg/m   Physical Exam Vitals signs and nursing note reviewed.  Constitutional:      General: She is not in acute distress.    Appearance: She is well-developed.  HENT:     Head: Normocephalic and atraumatic.  Eyes:     Conjunctiva/sclera: Conjunctivae normal.  Neck:     Musculoskeletal: Neck supple.  Cardiovascular:     Rate and Rhythm: Normal rate and regular rhythm.     Heart sounds: Normal heart sounds. No murmur.  Pulmonary:     Effort: Pulmonary effort is normal. No respiratory distress.     Breath sounds: Normal breath sounds. No wheezing, rhonchi or rales.  Abdominal:     General: Bowel sounds are normal.     Palpations: Abdomen is soft.     Tenderness: There is abdominal tenderness in the epigastric area. There is no right CVA tenderness, left CVA tenderness, guarding or rebound.  Skin:    General: Skin is warm and dry.  Neurological:     Mental Status: She is alert.     ED Treatments / Results  Labs (all labs ordered are listed, but only abnormal results are displayed) Labs Reviewed  COMPREHENSIVE METABOLIC PANEL - Abnormal; Notable for the following components:      Result Value   Glucose, Bld 109 (*)    Calcium 10.6 (*)    All other components within normal limits  URINALYSIS, ROUTINE W REFLEX MICROSCOPIC - Abnormal; Notable for the following components:   Specific Gravity, Urine >1.030 (*)    Hgb urine dipstick SMALL (*)    Protein, ur 30 (*)    All other components within normal limits  URINALYSIS, MICROSCOPIC (REFLEX) - Abnormal; Notable for the following  components:   Bacteria, UA RARE (*)    All other components within normal limits  SARS CORONAVIRUS 2 (TAT 6-24 HRS)  SURGICAL PCR SCREEN  SARS CORONAVIRUS 2 (HOSPITAL ORDER, PERFORMED IN [facility])  CBC WITH DIFFERENTIAL/PLATELET  LIPASE, BLOOD  PREGNANCY, URINE  HIV ANTIBODY (ROUTINE TESTING W REFLEX)  SURGICAL PATHOLOGY  TROPONIN I (HIGH SENSITIVITY)    EKG None   Date: 12/17/2018  Rate: 63  Rhythm: normal sinus rhythm  QRS Axis: normal  Intervals: normal  ST/T Wave abnormalities: normal  Conduction Disutrbances: none  Narrative Interpretation: normal ekg  Radiology No  results found.  Procedures Procedures (including critical care time) CRITICAL CARE Performed by: Karrie Meres   Total critical care time: 31 minutes  Critical care time was exclusive of separately billable procedures and treating other patients.  Critical care was necessary to treat or prevent imminent or life-threatening deterioration.  Critical care was time spent personally by me on the following activities: development of treatment plan with patient and/or surrogate as well as nursing, discussions with consultants, evaluation of patient's response to treatment, examination of patient, obtaining history from patient or surrogate, ordering and performing treatments and interventions, ordering and review of laboratory studies, ordering and review of radiographic studies, pulse oximetry and re-evaluation of patient's condition.   Medications Ordered in ED Medications  famotidine (PEPCID) IVPB 20 mg premix (0 mg Intravenous Stopped 12/10/18 2042)  ondansetron (ZOFRAN) injection 4 mg (4 mg Intravenous Given 12/10/18 2006)  morphine 4 MG/ML injection 4 mg (4 mg Intravenous Given 12/10/18 2008)  sodium chloride 0.9 % bolus 500 mL (0 mLs Intravenous Stopped 12/10/18 2043)  cefTRIAXone (ROCEPHIN) 2 g in sodium chloride 0.9 % 100 mL IVPB (0 g Intravenous Stopped 12/10/18 2116)   oxyCODONE-acetaminophen (PERCOCET/ROXICET) 5-325 MG per tablet 1 tablet (1 tablet Oral Given 12/10/18 2119)  morphine 4 MG/ML injection 4 mg (4 mg Intravenous Given 12/10/18 2303)  ondansetron (ZOFRAN) injection 4 mg (4 mg Intravenous Given 12/10/18 2302)  Chlorhexidine Gluconate Cloth 2 % PADS 6 each (6 each Topical Given 12/11/18 0734)  acetaminophen (TYLENOL) tablet 1,000 mg (1,000 mg Oral Given 12/11/18 0729)  gabapentin (NEURONTIN) capsule 300 mg (300 mg Oral Given 12/11/18 0729)  ceFAZolin (ANCEF) IVPB 2g/100 mL premix (2 g Intravenous Given 12/11/18 0936)    And  metroNIDAZOLE (FLAGYL) IVPB 500 mg (500 mg Intravenous Given 12/11/18 0950)  celecoxib (CELEBREX) capsule 200 mg (200 mg Oral Given 12/11/18 0729)  lactated ringers bolus 1,000 mL (1,000 mLs Intravenous New Bag/Given 12/11/18 0425)  propofol (DIPRIVAN) 10 mg/mL bolus/IV push (has no administration in time range)  fentaNYL (SUBLIMAZE) 250 MCG/5ML injection (has no administration in time range)  midazolam (VERSED) 2 MG/2ML injection (has no administration in time range)  succinylcholine (ANECTINE) 200 MG/10ML syringe (has no administration in time range)  lidocaine 20 MG/ML injection (has no administration in time range)  rocuronium bromide 100 MG/10ML SOSY (has no administration in time range)  ePHEDrine 5 MG/ML injection (has no administration in time range)  phenylephrine 0.4-0.9 MG/10ML-% injection (has no administration in time range)  dexamethasone (DECADRON) 10 MG/ML injection (has no administration in time range)  ondansetron (ZOFRAN) 4 MG/2ML injection (has no administration in time range)  etomidate (AMIDATE) 2 MG/ML injection (has no administration in time range)  propofol (DIPRIVAN) 10 mg/mL bolus/IV push (has no administration in time range)  bupivacaine (PF) (MARCAINE) 0.25 % injection (has no administration in time range)  lidocaine 20 MG/ML injection (has no administration in time range)  rocuronium bromide 100  MG/10ML SOSY (has no administration in time range)  ePHEDrine 5 MG/ML injection (has no administration in time range)  phenylephrine 0.4-0.9 MG/10ML-% injection (has no administration in time range)  dexamethasone (DECADRON) 10 MG/ML injection (has no administration in time range)  ondansetron (ZOFRAN) 4 MG/2ML injection (has no administration in time range)  sugammadex sodium (BRIDION) 500 MG/5ML injection (has no administration in time range)  fentaNYL (SUBLIMAZE) 250 MCG/5ML injection (has no administration in time range)  sugammadex sodium (BRIDION) 500 MG/5ML injection (has no administration in time range)     Initial  Impression / Assessment and Plan / ED Course  I have reviewed the triage vital signs and the nursing notes.  Pertinent labs & imaging results that were available during my care of the patient were reviewed by me and considered in my medical decision making (see chart for details).    Final Clinical Impressions(s) / ED Diagnoses   Final diagnoses:  Abdominal pain, RUQ  Acute cholecystitis   48 y/o F presenting with epigastric abd pain and lower chest pain that started this AM  On exam, epigastric ttp. Lungs ctab. Heart with rrr.   CBC w/o leukocytosis, no anemia CMP with normal kidney function and lfts Lipase wnl Trop neg UA with hematuria, no leuks or nitrites Urine preg neg  RUQ US with Overall findings consistent with acute calculus cholecystitis in the appropriate clinical setting.  - initially felt that this could represent biliary colic given there is no thickening of the gallbladder wall and no pericholecystic fluid. pt given ceftriaxone. Attempted pain control in the ED and pts pain persisted, increased concern for cholecystitis.   10:47 PM CONSULT with Dr. Michaell CowingGross who recommends ED to ED transfer. He will see pt in the ED and make a decision.   10:51 PM Discussed case with Dr. Criss AlvineGoldston who accepts patient for transfer.   ED Discharge Orders          Ordered    Diet - low sodium heart healthy     12/12/18 0812    Increase activity slowly     12/12/18 [id number]    Discharge instructions    Comments: [facility]  LAPAROSCOPIC SURGERY:  POST-OP INSTRUCTIONS  Always review your discharge instruction sheet given to you by the facility where your surgery was performed.  A prescription for pain medication may be given to you upon discharge.  Take your pain medication as prescribed.  If narcotic pain medicine is not needed, then you may take acetaminophen (Tylenol) or ibuprofen (Advil) as needed.  Take your usually prescribed medications unless otherwise directed.  If you need a refill on your pain medication, please contact your pharmacy.  They will contact our office to request authorization. Prescriptions will not be filled after 5 P.M. or on weekends.  You should follow a light diet the first few days after arrival home, such as soup and crackers or toast.  Be sure to include plenty of fluids daily.  Most patients will experience some swelling and bruising in the area of the incisions.  Ice packs will help.  Swelling and bruising can take several days to resolve.   It is common to experience some constipation after surgery.  Increasing fluid intake and taking a stool softener (such as Colace) will usually help or prevent this problem from occurring.  A mild laxative (Milk of Magnesia or Miralax) should be taken according to package instructions if there has been no bowel movement after 48 hours.  You will likely have Dermabond (topical glue) over your incisions.  This seals the incisions and allows you to bathe and shower at any time after your surgery.  Glue should remain in place for up to 10 days.  It may be removed after 10 days by pealing off the Dermabond material or using Vaseline or naval jelly to remove.  If you have steri-strips over your incisions, you may remove the gauze bandage on the second day after surgery,  and you may shower at that time.  Leave your steri-strips (small skin tapes) in place directly  over the incision.  These strips should remain on the skin for 5-7 days and then be removed.  You may get them wet in the shower and pat them dry.  Any sutures or staples will be removed at the office during your follow-up visit.  ACTIVITIES:  You may resume regular (light) daily activities beginning the next day - such as daily self-care, walking, climbing stairs - gradually increasing activities as tolerated.  You may have sexual intercourse when it is comfortable.  Refrain from any heavy lifting or straining until approved by your doctor.  You may drive when you are no longer taking prescription pain medication, when you can comfortably wear a seatbelt, and when you can safely maneuver your car and apply brakes.  You should see your doctor in the office for a follow-up appointment approximately 2-3 weeks after your surgery.  Make sure that you call for this appointment within a day or two after you arrive home to insure a convenient appointment time.  WHEN TO CALL YOUR DOCTOR: Fever over 101.0 Inability to urinate Continued bleeding from incision Increased pain, redness, or drainage from the incision Increasing abdominal pain  The clinic staff is available to answer your questions during regular business hours.  Please don't hesitate to call and ask to speak to one of the nurses for clinical concerns.  If you have a medical emergency, go to the nearest emergency room or call 911.  A surgeon from [facility] is always on call for the hospital.  Velora Heckler, MD, [facility] Office: [phone] Toll Free:  [phone] FAX [phone]  Website: [email or web address]   12/12/18 0812    No dressing needed     12/12/18 0812    traMADol (ULTRAM) 50 MG tablet  Every 6 hours PRN     12/12/18 [id number]           Karrie Meres, PA-C 12/10/18  2329    Raeford Razor, MD 12/12/18 2032    Samson Frederic, Kimra Kantor S, PA-C 12/17/18 9629    Raeford Razor, MD 12/22/18 [phone]

## 2018-12-11 ENCOUNTER — Encounter (HOSPITAL_COMMUNITY): Payer: Self-pay | Admitting: Surgery

## 2018-12-11 ENCOUNTER — Inpatient Hospital Stay (HOSPITAL_COMMUNITY): Payer: BC Managed Care – PPO | Admitting: Certified Registered"

## 2018-12-11 ENCOUNTER — Encounter (HOSPITAL_COMMUNITY)
Admission: EM | Disposition: A | Discharge status: Home / Self Care | Payer: Self-pay | Source: Home / Self Care | Attending: Emergency Medicine

## 2018-12-11 ENCOUNTER — Inpatient Hospital Stay (HOSPITAL_COMMUNITY): Payer: BC Managed Care – PPO

## 2018-12-11 DIAGNOSIS — F419 Anxiety disorder, unspecified: Secondary | ICD-10-CM | POA: Diagnosis not present

## 2018-12-11 DIAGNOSIS — Z79899 Other long term (current) drug therapy: Secondary | ICD-10-CM | POA: Diagnosis not present

## 2018-12-11 DIAGNOSIS — K8000 Calculus of gallbladder with acute cholecystitis without obstruction: Secondary | ICD-10-CM | POA: Diagnosis present

## 2018-12-11 DIAGNOSIS — K8012 Calculus of gallbladder with acute and chronic cholecystitis without obstruction: Secondary | ICD-10-CM | POA: Diagnosis not present

## 2018-12-11 DIAGNOSIS — F411 Generalized anxiety disorder: Secondary | ICD-10-CM | POA: Diagnosis present

## 2018-12-11 DIAGNOSIS — Z20828 Contact with and (suspected) exposure to other viral communicable diseases: Secondary | ICD-10-CM | POA: Diagnosis not present

## 2018-12-11 DIAGNOSIS — G43909 Migraine, unspecified, not intractable, without status migrainosus: Secondary | ICD-10-CM | POA: Diagnosis not present

## 2018-12-11 DIAGNOSIS — R319 Hematuria, unspecified: Secondary | ICD-10-CM | POA: Diagnosis present

## 2018-12-11 DIAGNOSIS — K589 Irritable bowel syndrome without diarrhea: Secondary | ICD-10-CM | POA: Diagnosis present

## 2018-12-11 DIAGNOSIS — K810 Acute cholecystitis: Secondary | ICD-10-CM | POA: Diagnosis present

## 2018-12-11 DIAGNOSIS — J309 Allergic rhinitis, unspecified: Secondary | ICD-10-CM | POA: Diagnosis present

## 2018-12-11 HISTORY — PX: CHOLECYSTECTOMY: SHX55

## 2018-12-11 LAB — SURGICAL PCR SCREEN
MRSA, PCR: NEGATIVE
Staphylococcus aureus: NEGATIVE

## 2018-12-11 LAB — SARS CORONAVIRUS 2 (TAT 6-24 HRS): SARS Coronavirus 2: NEGATIVE

## 2018-12-11 LAB — HIV ANTIBODY (ROUTINE TESTING W REFLEX): HIV Screen 4th Generation wRfx: NONREACTIVE

## 2018-12-11 LAB — SARS CORONAVIRUS 2 BY RT PCR (HOSPITAL ORDER, PERFORMED IN ~~LOC~~ HOSPITAL LAB): SARS Coronavirus 2: NEGATIVE

## 2018-12-11 SURGERY — LAPAROSCOPIC CHOLECYSTECTOMY WITH INTRAOPERATIVE CHOLANGIOGRAM
Anesthesia: General | Site: Abdomen

## 2018-12-11 MED ORDER — PHENYLEPHRINE 40 MCG/ML (10ML) SYRINGE FOR IV PUSH (FOR BLOOD PRESSURE SUPPORT)
PREFILLED_SYRINGE | INTRAVENOUS | Status: AC
  Filled 2018-12-11: qty 10

## 2018-12-11 MED ORDER — CEFAZOLIN SODIUM-DEXTROSE 2-4 GM/100ML-% IV SOLN
2.0000 g | INTRAVENOUS | Status: AC
  Administered 2018-12-11: 2 g via INTRAVENOUS
  Filled 2018-12-11 (×2): qty 100

## 2018-12-11 MED ORDER — BUPIVACAINE HCL (PF) 0.25 % IJ SOLN
INTRAMUSCULAR | Status: DC | PRN
  Administered 2018-12-11: 30 mL

## 2018-12-11 MED ORDER — METRONIDAZOLE IN NACL 5-0.79 MG/ML-% IV SOLN
500.0000 mg | INTRAVENOUS | Status: AC
  Administered 2018-12-11: 500 mg via INTRAVENOUS
  Filled 2018-12-11: qty 100

## 2018-12-11 MED ORDER — SUCCINYLCHOLINE CHLORIDE 200 MG/10ML IV SOSY
PREFILLED_SYRINGE | INTRAVENOUS | Status: AC
  Filled 2018-12-11: qty 10

## 2018-12-11 MED ORDER — FENTANYL CITRATE (PF) 250 MCG/5ML IJ SOLN
INTRAMUSCULAR | Status: AC
  Filled 2018-12-11: qty 5

## 2018-12-11 MED ORDER — SUMATRIPTAN SUCCINATE 50 MG PO TABS
100.0000 mg | ORAL_TABLET | ORAL | Status: DC | PRN
  Filled 2018-12-11: qty 2

## 2018-12-11 MED ORDER — BUPIVACAINE LIPOSOME 1.3 % IJ SUSP
20.0000 mL | Freq: Once | INTRAMUSCULAR | Status: DC
  Filled 2018-12-11: qty 20

## 2018-12-11 MED ORDER — ETOMIDATE 2 MG/ML IV SOLN
INTRAVENOUS | Status: AC
  Filled 2018-12-11: qty 10

## 2018-12-11 MED ORDER — KCL IN DEXTROSE-NACL 20-5-0.45 MEQ/L-%-% IV SOLN
INTRAVENOUS | Status: DC
  Administered 2018-12-11 – 2018-12-12 (×2): via INTRAVENOUS
  Filled 2018-12-11 (×2): qty 1000

## 2018-12-11 MED ORDER — ONDANSETRON HCL 4 MG/2ML IJ SOLN
INTRAMUSCULAR | Status: AC
  Filled 2018-12-11: qty 2

## 2018-12-11 MED ORDER — SODIUM CHLORIDE 0.9 % IV SOLN: Freq: Three times a day (TID) | INTRAVENOUS | Status: DC | PRN

## 2018-12-11 MED ORDER — ACETAMINOPHEN 325 MG PO TABS: 650.0000 mg | ORAL_TABLET | Freq: Four times a day (QID) | ORAL | Status: DC | PRN

## 2018-12-11 MED ORDER — ONDANSETRON HCL 4 MG/2ML IJ SOLN: 4.0000 mg | Freq: Four times a day (QID) | INTRAMUSCULAR | Status: DC | PRN

## 2018-12-11 MED ORDER — ONDANSETRON HCL 4 MG/2ML IJ SOLN
INTRAMUSCULAR | Status: DC | PRN
  Administered 2018-12-11: 4 mg via INTRAVENOUS

## 2018-12-11 MED ORDER — SUGAMMADEX SODIUM 500 MG/5ML IV SOLN
INTRAVENOUS | Status: AC
  Filled 2018-12-11: qty 5

## 2018-12-11 MED ORDER — 0.9 % SODIUM CHLORIDE (POUR BTL) OPTIME
TOPICAL | Status: DC | PRN
  Administered 2018-12-11: 1000 mL

## 2018-12-11 MED ORDER — EPHEDRINE 5 MG/ML INJ
INTRAVENOUS | Status: AC
  Filled 2018-12-11: qty 10

## 2018-12-11 MED ORDER — ENALAPRILAT 1.25 MG/ML IV SOLN
0.6250 mg | Freq: Four times a day (QID) | INTRAVENOUS | Status: DC | PRN
  Filled 2018-12-11: qty 1

## 2018-12-11 MED ORDER — CITALOPRAM HYDROBROMIDE 20 MG PO TABS: 40.0000 mg | ORAL_TABLET | Freq: Every day | ORAL | Status: DC

## 2018-12-11 MED ORDER — ALUM & MAG HYDROXIDE-SIMETH 200-200-20 MG/5ML PO SUSP: 30.0000 mL | Freq: Four times a day (QID) | ORAL | Status: DC | PRN

## 2018-12-11 MED ORDER — ACETAMINOPHEN 650 MG RE SUPP: 650.0000 mg | Freq: Four times a day (QID) | RECTAL | Status: DC | PRN

## 2018-12-11 MED ORDER — ACETAMINOPHEN 325 MG PO TABS
650.0000 mg | ORAL_TABLET | Freq: Four times a day (QID) | ORAL | Status: DC | PRN
  Administered 2018-12-11: 650 mg via ORAL
  Filled 2018-12-11: qty 2

## 2018-12-11 MED ORDER — ONDANSETRON 4 MG PO TBDP: 4.0000 mg | ORAL_TABLET | Freq: Four times a day (QID) | ORAL | Status: DC | PRN

## 2018-12-11 MED ORDER — PHENTERMINE HCL 37.5 MG PO CAPS: 37.5000 mg | ORAL_CAPSULE | Freq: Every day | ORAL | Status: DC

## 2018-12-11 MED ORDER — METHOCARBAMOL 1000 MG/10ML IJ SOLN
1000.0000 mg | Freq: Four times a day (QID) | INTRAVENOUS | Status: DC | PRN
  Filled 2018-12-11: qty 10

## 2018-12-11 MED ORDER — MAGIC MOUTHWASH
15.0000 mL | Freq: Four times a day (QID) | ORAL | Status: DC | PRN
  Filled 2018-12-11: qty 15

## 2018-12-11 MED ORDER — FENTANYL CITRATE (PF) 250 MCG/5ML IJ SOLN
INTRAMUSCULAR | Status: DC | PRN
  Administered 2018-12-11 (×6): 50 ug via INTRAVENOUS

## 2018-12-11 MED ORDER — ROCURONIUM BROMIDE 10 MG/ML (PF) SYRINGE
PREFILLED_SYRINGE | INTRAVENOUS | Status: DC | PRN
  Administered 2018-12-11: 60 mg via INTRAVENOUS

## 2018-12-11 MED ORDER — PROCHLORPERAZINE EDISYLATE 10 MG/2ML IJ SOLN: 5.0000 mg | INTRAMUSCULAR | Status: DC | PRN

## 2018-12-11 MED ORDER — OXYCODONE HCL 5 MG PO TABS: 5.0000 mg | ORAL_TABLET | Freq: Once | ORAL | Status: DC | PRN

## 2018-12-11 MED ORDER — HYDROCORTISONE (PERIANAL) 2.5 % EX CREA: 1.0000 "application " | TOPICAL_CREAM | Freq: Four times a day (QID) | CUTANEOUS | Status: DC | PRN

## 2018-12-11 MED ORDER — PROMETHAZINE HCL 25 MG/ML IJ SOLN: 6.2500 mg | INTRAMUSCULAR | Status: DC | PRN

## 2018-12-11 MED ORDER — PROPOFOL 10 MG/ML IV BOLUS
INTRAVENOUS | Status: AC
  Filled 2018-12-11: qty 20

## 2018-12-11 MED ORDER — ROCURONIUM BROMIDE 10 MG/ML (PF) SYRINGE
PREFILLED_SYRINGE | INTRAVENOUS | Status: AC
  Filled 2018-12-11: qty 10

## 2018-12-11 MED ORDER — MIDAZOLAM HCL 2 MG/2ML IJ SOLN
INTRAMUSCULAR | Status: AC
  Filled 2018-12-11: qty 2

## 2018-12-11 MED ORDER — GUAIFENESIN-DM 100-10 MG/5ML PO SYRP: 10.0000 mL | ORAL_SOLUTION | ORAL | Status: DC | PRN

## 2018-12-11 MED ORDER — LIDOCAINE 2% (20 MG/ML) 5 ML SYRINGE
INTRAMUSCULAR | Status: AC
  Filled 2018-12-11: qty 5

## 2018-12-11 MED ORDER — DEXAMETHASONE SODIUM PHOSPHATE 10 MG/ML IJ SOLN
INTRAMUSCULAR | Status: DC | PRN
  Administered 2018-12-11: 10 mg via INTRAVENOUS

## 2018-12-11 MED ORDER — MENTHOL 3 MG MT LOZG: 1.0000 | LOZENGE | OROMUCOSAL | Status: DC | PRN

## 2018-12-11 MED ORDER — DEXAMETHASONE SODIUM PHOSPHATE 10 MG/ML IJ SOLN
INTRAMUSCULAR | Status: AC
  Filled 2018-12-11: qty 1

## 2018-12-11 MED ORDER — GABAPENTIN 300 MG PO CAPS
300.0000 mg | ORAL_CAPSULE | ORAL | Status: AC
  Administered 2018-12-11: 300 mg via ORAL
  Filled 2018-12-11: qty 1

## 2018-12-11 MED ORDER — HYDROCORTISONE 1 % EX CREA: 1.0000 "application " | TOPICAL_CREAM | Freq: Three times a day (TID) | CUTANEOUS | Status: DC | PRN

## 2018-12-11 MED ORDER — LORAZEPAM 2 MG/ML IJ SOLN: 0.5000 mg | Freq: Three times a day (TID) | INTRAMUSCULAR | Status: DC | PRN

## 2018-12-11 MED ORDER — CELECOXIB 200 MG PO CAPS
200.0000 mg | ORAL_CAPSULE | ORAL | Status: AC
  Administered 2018-12-11: 07:00:00 200 mg via ORAL
  Filled 2018-12-11: qty 1

## 2018-12-11 MED ORDER — LIDOCAINE 2% (20 MG/ML) 5 ML SYRINGE
INTRAMUSCULAR | Status: DC | PRN
  Administered 2018-12-11: 100 mg via INTRAVENOUS

## 2018-12-11 MED ORDER — LACTATED RINGERS IV BOLUS: 1000.0000 mL | Freq: Three times a day (TID) | INTRAVENOUS | Status: DC | PRN

## 2018-12-11 MED ORDER — FENTANYL CITRATE (PF) 100 MCG/2ML IJ SOLN: 25.0000 ug | INTRAMUSCULAR | Status: DC | PRN

## 2018-12-11 MED ORDER — BUPIVACAINE HCL (PF) 0.25 % IJ SOLN
INTRAMUSCULAR | Status: AC
  Filled 2018-12-11: qty 30

## 2018-12-11 MED ORDER — SODIUM CHLORIDE 0.9 % IV SOLN: 2.0000 g | INTRAVENOUS | Status: DC

## 2018-12-11 MED ORDER — CHLORHEXIDINE GLUCONATE CLOTH 2 % EX PADS: 6.0000 | MEDICATED_PAD | Freq: Once | CUTANEOUS | Status: DC

## 2018-12-11 MED ORDER — MIDAZOLAM HCL 2 MG/2ML IJ SOLN
INTRAMUSCULAR | Status: DC | PRN
  Administered 2018-12-11: 2 mg via INTRAVENOUS

## 2018-12-11 MED ORDER — BISACODYL 10 MG RE SUPP: 10.0000 mg | Freq: Every day | RECTAL | Status: DC | PRN

## 2018-12-11 MED ORDER — ALPRAZOLAM 0.5 MG PO TABS: 0.5000 mg | ORAL_TABLET | Freq: Three times a day (TID) | ORAL | Status: DC | PRN

## 2018-12-11 MED ORDER — HYDROMORPHONE HCL 1 MG/ML IJ SOLN: 1.0000 mg | INTRAMUSCULAR | Status: DC | PRN

## 2018-12-11 MED ORDER — LACTATED RINGERS IV BOLUS
1000.0000 mL | Freq: Once | INTRAVENOUS | Status: AC
  Administered 2018-12-11: 04:00:00 1000 mL via INTRAVENOUS

## 2018-12-11 MED ORDER — PROPOFOL 10 MG/ML IV BOLUS
INTRAVENOUS | Status: DC | PRN
  Administered 2018-12-11: 150 mg via INTRAVENOUS

## 2018-12-11 MED ORDER — DIPHENHYDRAMINE HCL 50 MG/ML IJ SOLN: 12.5000 mg | Freq: Four times a day (QID) | INTRAMUSCULAR | Status: DC | PRN

## 2018-12-11 MED ORDER — SIMETHICONE 80 MG PO CHEW: 40.0000 mg | CHEWABLE_TABLET | Freq: Four times a day (QID) | ORAL | Status: DC | PRN

## 2018-12-11 MED ORDER — ACETAMINOPHEN 500 MG PO TABS
1000.0000 mg | ORAL_TABLET | ORAL | Status: AC
  Administered 2018-12-11: 1000 mg via ORAL
  Filled 2018-12-11: qty 2

## 2018-12-11 MED ORDER — HYDROCODONE-ACETAMINOPHEN 5-325 MG PO TABS: 1.0000 | ORAL_TABLET | ORAL | Status: DC | PRN

## 2018-12-11 MED ORDER — SUGAMMADEX SODIUM 500 MG/5ML IV SOLN
INTRAVENOUS | Status: DC | PRN
  Administered 2018-12-11: 300 mg via INTRAVENOUS

## 2018-12-11 MED ORDER — LACTATED RINGERS IR SOLN
Status: DC | PRN
  Administered 2018-12-11: 1000 mL

## 2018-12-11 MED ORDER — LACTATED RINGERS IV SOLN
INTRAVENOUS | Status: DC
  Administered 2018-12-11 (×2): via INTRAVENOUS

## 2018-12-11 MED ORDER — CHLORHEXIDINE GLUCONATE CLOTH 2 % EX PADS
6.0000 | MEDICATED_PAD | Freq: Once | CUTANEOUS | Status: AC
  Administered 2018-12-11: 08:00:00 6 via TOPICAL

## 2018-12-11 MED ORDER — HYDROMORPHONE HCL 1 MG/ML IJ SOLN: 0.5000 mg | INTRAMUSCULAR | Status: DC | PRN

## 2018-12-11 MED ORDER — TRAMADOL HCL 50 MG PO TABS
50.0000 mg | ORAL_TABLET | Freq: Four times a day (QID) | ORAL | Status: DC | PRN
  Administered 2018-12-11 – 2018-12-12 (×2): 50 mg via ORAL
  Filled 2018-12-11 (×2): qty 1

## 2018-12-11 MED ORDER — IOHEXOL 300 MG/ML  SOLN
INTRAMUSCULAR | Status: DC | PRN
  Administered 2018-12-11: 8.5 mL

## 2018-12-11 MED ORDER — PHENOL 1.4 % MT LIQD: 1.0000 | OROMUCOSAL | Status: DC | PRN

## 2018-12-11 MED ORDER — LIP MEDEX EX OINT
1.0000 "application " | TOPICAL_OINTMENT | Freq: Two times a day (BID) | CUTANEOUS | Status: DC
  Administered 2018-12-11: 1 via TOPICAL
  Filled 2018-12-11: qty 7

## 2018-12-11 MED ORDER — ENOXAPARIN SODIUM 40 MG/0.4ML ~~LOC~~ SOLN: 40.0000 mg | Freq: Every day | SUBCUTANEOUS | Status: DC

## 2018-12-11 MED ORDER — METOPROLOL TARTRATE 5 MG/5ML IV SOLN: 5.0000 mg | Freq: Four times a day (QID) | INTRAVENOUS | Status: DC | PRN

## 2018-12-11 MED ORDER — OXYCODONE HCL 5 MG/5ML PO SOLN: 5.0000 mg | Freq: Once | ORAL | Status: DC | PRN

## 2018-12-11 SURGICAL SUPPLY — 34 items
ADH SKN CLS APL DERMABOND .7 (GAUZE/BANDAGES/DRESSINGS) ×1
APL PRP STRL LF DISP 70% ISPRP (MISCELLANEOUS) ×1
APPLIER CLIP ROT 10 11.4 M/L (STAPLE) ×3
APR CLP MED LRG 11.4X10 (STAPLE) ×1
BAG SPEC RTRVL LRG 6X4 10 (ENDOMECHANICALS) ×1
CABLE HIGH FREQUENCY MONO STRZ (ELECTRODE) ×3 IMPLANT
CHLORAPREP W/TINT 26 (MISCELLANEOUS) ×4 IMPLANT
CLIP APPLIE ROT 10 11.4 M/L (STAPLE) ×1 IMPLANT
COVER MAYO STAND STRL (DRAPES) ×3 IMPLANT
COVER SURGICAL LIGHT HANDLE (MISCELLANEOUS) ×3 IMPLANT
COVER WAND RF STERILE (DRAPES) IMPLANT
DECANTER SPIKE VIAL GLASS SM (MISCELLANEOUS) ×3 IMPLANT
DERMABOND ADVANCED (GAUZE/BANDAGES/DRESSINGS) ×2
DERMABOND ADVANCED .7 DNX12 (GAUZE/BANDAGES/DRESSINGS) IMPLANT
DRAPE C-ARM 42X120 X-RAY (DRAPES) ×3 IMPLANT
GLOVE SURG ORTHO 8.0 STRL STRW (GLOVE) ×3 IMPLANT
GOWN STRL REUS W/TWL XL LVL3 (GOWN DISPOSABLE) ×6 IMPLANT
HEMOSTAT SURGICEL 4X8 (HEMOSTASIS) IMPLANT
KIT BASIN OR (CUSTOM PROCEDURE TRAY) ×3 IMPLANT
KIT TURNOVER KIT A (KITS) ×2 IMPLANT
PENCIL SMOKE EVACUATOR (MISCELLANEOUS) IMPLANT
POUCH SPECIMEN RETRIEVAL 10MM (ENDOMECHANICALS) ×3 IMPLANT
SCISSORS LAP 5X35 DISP (ENDOMECHANICALS) ×3 IMPLANT
SET CHOLANGIOGRAPH MIX (MISCELLANEOUS) ×3 IMPLANT
SET IRRIG TUBING LAPAROSCOPIC (IRRIGATION / IRRIGATOR) ×3 IMPLANT
SET TUBE SMOKE EVAC HIGH FLOW (TUBING) ×2 IMPLANT
SLEEVE XCEL OPT CAN 5 100 (ENDOMECHANICALS) ×3 IMPLANT
SUT MNCRL AB 4-0 PS2 18 (SUTURE) ×3 IMPLANT
TOWEL OR 17X26 10 PK STRL BLUE (TOWEL DISPOSABLE) ×3 IMPLANT
TOWEL OR NON WOVEN STRL DISP B (DISPOSABLE) ×3 IMPLANT
TRAY LAPAROSCOPIC (CUSTOM PROCEDURE TRAY) ×3 IMPLANT
TROCAR BLADELESS OPT 5 100 (ENDOMECHANICALS) ×3 IMPLANT
TROCAR XCEL BLUNT TIP 100MML (ENDOMECHANICALS) ×3 IMPLANT
TROCAR XCEL NON-BLD 11X100MML (ENDOMECHANICALS) ×3 IMPLANT

## 2018-12-11 NOTE — Anesthesia Preprocedure Evaluation (Addendum)
Anesthesia Evaluation  Patient identified by MRN, date of birth, ID band Patient awake  General Assessment Comment:Acute cholecystitis  Reviewed: Allergy & Precautions, NPO status , Patient's Chart, lab work & pertinent test results  Airway Mallampati: II  TM Distance: >3 FB Neck ROM: Full    Dental no notable dental hx.    Pulmonary neg pulmonary ROS,    Pulmonary exam normal breath sounds clear to auscultation       Cardiovascular negative cardio ROS Normal cardiovascular exam Rhythm:Regular Rate:Normal     Neuro/Psych negative neurological ROS  negative psych ROS   GI/Hepatic negative GI ROS, Neg liver ROS,   Endo/Other  negative endocrine ROS  Renal/GU negative Renal ROS  negative genitourinary   Musculoskeletal negative musculoskeletal ROS (+)   Abdominal   Peds negative pediatric ROS (+)  Hematology negative hematology ROS (+)   Anesthesia Other Findings   Reproductive/Obstetrics negative OB ROS                             Anesthesia Physical Anesthesia Plan  ASA: II and emergent  Anesthesia Plan: General   Post-op Pain Management:    Induction: Intravenous  PONV Risk Score and Plan: 3 and Ondansetron, Dexamethasone, Treatment may vary due to age or medical condition and Midazolam  Airway Management Planned: Oral ETT  Additional Equipment:   Intra-op Plan:   Post-operative Plan: Extubation in OR  Informed Consent: I have reviewed the patients History and Physical, chart, labs and discussed the procedure including the risks, benefits and alternatives for the proposed anesthesia with the patient or authorized representative who has indicated his/her understanding and acceptance.   Dental advisory given  Plan Discussed with: CRNA and Surgeon  Anesthesia Plan Comments:         Anesthesia Quick Evaluation  

## 2018-12-11 NOTE — ED Provider Notes (Signed)
[facility] Emergency Department Provider Note MRN:  [id number]  Arrival date & time: 12/11/18     Chief Complaint   Abdominal Pain   History of Present Illness   Chloe Conley is a 48 y.o. year-old female with no pertinent past medical history presenting to the ED with chief complaint of abdominal pain.  Location: Right upper quadrant Duration: 12 hours Onset: Gradual Timing: Constant Description: Dull Severity: Moderate to severe Exacerbating/Alleviating Factors: None Associated Symptoms: Nausea Pertinent Negatives: Denies fever, no vomiting, no diarrhea, no chest pain, shortness of breath   Review of Systems  A complete 10 system review of systems was obtained and all systems are negative except as noted in the HPI and PMH.   Patient's Health History    Past Medical History:  Diagnosis Date  . Allergy   . Anxiety   . Frequent PVCs    sees Dr. Virl Axe   . Headache(784.0)   . IBS (irritable bowel syndrome)   . Routine gynecological examination    sees Dr. Vanessa Kick  . 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 ABDOMINAL HYSTERECTOMY    . BREAST ENHANCEMENT SURGERY    . CESAREAN SECTION  04/30/2005   Dr Quincy Simmonds  . COLONOSCOPY  01/01/2013   per Dr. Collene Mares, clear, repeat at age 1   . DIAGNOSTIC LAPAROSCOPY    . DILATION AND EVACUATION  05/14/2003  . FERTILITY SURGERY     x 3  . REDUCTION MAMMAPLASTY Bilateral    bilat lift  . RHINOPLASTY    . SEPTOPLASTY      Family History  Problem Relation Age of Onset  . Diabetes Other   . Migraines Other     Social History   Socioeconomic History  . Marital status: Married    Spouse name: Not on file  . Number of children: Not on file  . Years of education: Not on file  . Highest education level: Not on file  Occupational History  . Not on file  Social Needs  . Financial resource strain: Not on file  . Food insecurity    Worry:  Not on file    Inability: Not on file  . Transportation needs    Medical: Not on file    Non-medical: Not on file  Tobacco Use  . Smoking status: Never Smoker  . Smokeless tobacco: Never Used  Substance and Sexual Activity  . Alcohol use: No    Alcohol/week: 0.0 standard drinks  . Drug use: No  . Sexual activity: Not on file  Lifestyle  . Physical activity    Days per week: Not on file    Minutes per session: Not on file  . Stress: Not on file  Relationships  . Social Herbalist on phone: Not on file    Gets together: Not on file    Attends religious service: Not on file    Active member of club or organization: Not on file    Attends meetings of clubs or organizations: Not on file    Relationship status: Not on file  . Intimate partner violence    Fear of current or ex partner: Not on file    Emotionally abused: Not on file    Physically abused: Not on file    Forced sexual activity: Not on file  Other Topics Concern  . Not on file  Social History Narrative  .  Not on file     Physical Exam  Vital Signs and Nursing Notes reviewed Vitals:   12/10/18 2121 12/10/18 2306  BP: (!) 143/66 (!) 149/79  Pulse: 68 81  Resp: 15 16  Temp:    SpO2: 100% 99%    CONSTITUTIONAL: Well-appearing, NAD NEURO:  Alert and oriented x 3, no focal deficits EYES:  eyes equal and reactive ENT/NECK:  no LAD, no JVD CARDIO: Regular rate, well-perfused, normal S1 and S2 PULM:  CTAB no wheezing or rhonchi GI/GU:  normal bowel sounds, non-distended, mild right upper quadrant tenderness to palpation MSK/SPINE:  No gross deformities, no edema SKIN:  no rash, atraumatic PSYCH:  Appropriate speech and behavior  Diagnostic and Interventional Summary    EKG Interpretation  Date/Time:    Ventricular Rate:    PR Interval:    QRS Duration:   QT Interval:    QTC Calculation:   R Axis:     Text Interpretation:        Labs Reviewed  COMPREHENSIVE METABOLIC PANEL - Abnormal;  Notable for the following components:      Result Value   Glucose, Bld 109 (*)    Calcium 10.6 (*)    All other components within normal limits  URINALYSIS, ROUTINE W REFLEX MICROSCOPIC - Abnormal; Notable for the following components:   Specific Gravity, Urine >1.030 (*)    Hgb urine dipstick SMALL (*)    Protein, ur 30 (*)    All other components within normal limits  URINALYSIS, MICROSCOPIC (REFLEX) - Abnormal; Notable for the following components:   Bacteria, UA RARE (*)    All other components within normal limits  SARS CORONAVIRUS 2 (TAT 6-24 HRS)  CBC WITH DIFFERENTIAL/PLATELET  LIPASE, BLOOD  PREGNANCY, URINE  TROPONIN I (HIGH SENSITIVITY)  TROPONIN I (HIGH SENSITIVITY)    US Abdomen Limited RUQ  Final Result      Medications  Chlorhexidine Gluconate Cloth 2 % PADS 6 each (has no administration in time range)    And  Chlorhexidine Gluconate Cloth 2 % PADS 6 each (has no administration in time range)  acetaminophen (TYLENOL) tablet 1,000 mg (has no administration in time range)  bupivacaine liposome (EXPAREL) 1.3 % injection 266 mg (has no administration in time range)  gabapentin (NEURONTIN) capsule 300 mg (has no administration in time range)  ceFAZolin (ANCEF) IVPB 2g/100 mL premix (has no administration in time range)    And  metroNIDAZOLE (FLAGYL) IVPB 500 mg (has no administration in time range)  celecoxib (CELEBREX) capsule 200 mg (has no administration in time range)  famotidine (PEPCID) IVPB 20 mg premix (0 mg Intravenous Stopped 12/10/18 2042)  ondansetron (ZOFRAN) injection 4 mg (4 mg Intravenous Given 12/10/18 2006)  morphine 4 MG/ML injection 4 mg (4 mg Intravenous Given 12/10/18 2008)  sodium chloride 0.9 % bolus 500 mL (0 mLs Intravenous Stopped 12/10/18 2043)  cefTRIAXone (ROCEPHIN) 2 g in sodium chloride 0.9 % 100 mL IVPB (0 g Intravenous Stopped 12/10/18 2116)  oxyCODONE-acetaminophen (PERCOCET/ROXICET) 5-325 MG per tablet 1 tablet (1 tablet Oral Given  12/10/18 2119)  morphine 4 MG/ML injection 4 mg (4 mg Intravenous Given 12/10/18 2303)  ondansetron (ZOFRAN) injection 4 mg (4 mg Intravenous Given 12/10/18 2302)     Procedures  /  Critical Care .Critical Care Performed by: Sabas SousBero, Myrtha Tonkovich M, MD Authorized by: Sabas SousBero, Taro Hidrogo M, MD   Critical care provider statement:    Critical care time (minutes):  32   Critical care was necessary to treat  or prevent imminent or life-threatening deterioration of the following conditions: Acute cholecystitis.   Critical care was time spent personally by me on the following activities:  Discussions with consultants, evaluation of patient's response to treatment, examination of patient, ordering and performing treatments and interventions, ordering and review of laboratory studies, ordering and review of radiographic studies, pulse oximetry, re-evaluation of patient's condition, obtaining history from patient or surrogate and review of old charts   I assumed direction of critical care for this patient from another provider in my specialty: yes      ED Course and Medical Decision Making  I have reviewed the triage vital signs and the nursing notes.  Pertinent labs & imaging results that were available during my care of the patient were reviewed by me and considered in my medical decision making (see below for details).     Transferred from med [facility] for question acute cholecystitis.  Will page Dr. Michaell Cowing for evaluation.  Patient is well-appearing, normal vital signs, mild tenderness on exam.  To be admitted to general surgery for operative management.  Elmer Sow. Pilar Plate, MD [facility] Emergency Medicine [facility] [email or web address]  Final Clinical Impressions(s) / ED Diagnoses     ICD-10-CM   1. Acute cholecystitis  K81.0   2. Abdominal pain, RUQ  R10.11 US Abdomen Limited RUQ    US Abdomen Limited RUQ    ED Discharge Orders    None       Discharge Instructions  Discussed with and Provided to Patient:     Discharge Instructions     Go directly to the [facility] emergency department for further evaluation. Do not eat or drink anything on the way to the emergency room.        Sabas Sous, MD 12/11/18 [phone]

## 2018-12-11 NOTE — ED Notes (Signed)
ED TO INPATIENT HANDOFF REPORT  Name/Age/Gender Chloe Conley 48 y.o. female  Code Status    Code Status Orders  (From admission, onward)         Start     Ordered   12/11/18 0117  Full code  Continuous     12/11/18 0119        Code Status History    This patient has a current code status but no historical code status.   Advance Care Planning Activity      Home/SNF/Other Home  Chief Complaint chest & back pain..r/o possible gall bladder per Urgent Care  Level of Care/Admitting Diagnosis ED Disposition    ED Disposition Condition Comment   Admit  Hospital Area: [place] [[id number]]  Level of Care: Med-Surg [16]  Covid Evaluation: Asymptomatic Screening Protocol (No Symptoms)  Diagnosis: Acute calculous cholecystitis [169678]  Admitting Physician: [place], [place]  Attending Physician: CCS, MD [[id number]]  Estimated length of stay: past midnight tomorrow  Certification:: I certify this patient will need inpatient services for at least 2 midnights  Bed request comments: 5W preferred  PT Class (Do Not Modify): Inpatient [101]  PT Acc Code (Do Not Modify): Private [1]       Medical History Past Medical History:  Diagnosis Date  . Allergy   . Anxiety   . Frequent PVCs    sees Dr. Virl Axe   . Headache(784.0)   . IBS (irritable bowel syndrome)   . Routine gynecological examination    sees Dr. Vanessa Kick  . 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1 36.0 - 46.0 %   MCV 91.7 80.0 - 100.0 fL   MCH 30.6 26.0 - 34.0 pg   MCHC 33.3 30.0 - 36.0 g/dL   RDW 11.6 11.5 - 15.5 %   Platelets 313 150 - 400 K/uL   nRBC 0.0 0.0 - 0.2 %   Neutrophils Relative % 74 %   Neutro Abs 7.5 1.7 - 7.7 K/uL   Lymphocytes Relative 20 %   Lymphs Abs 2.0 0.7 - 4.0 K/uL   Monocytes Relative 5 %   Monocytes Absolute 0.5 0.1 - 1.0 K/uL   Eosinophils Relative 0 %   Eosinophils Absolute 0.0 0.0 - 0.5 K/uL   Basophils Relative 1 %   Basophils Absolute 0.1 0.0 - 0.1 K/uL   Immature Granulocytes 0 %   Abs Immature Granulocytes 0.04 0.00 - 0.07 K/uL    Comment: Performed at [facility], [address] [place], [place] [address]  Comprehensive metabolic panel     Status: Abnormal   Collection Time: 12/10/18  7:55 PM  Result Value Ref Range   Sodium 135 135 - 145  mmol/L   Potassium 3.7 3.5 - 5.1 mmol/L   Chloride 99 98 - 111 mmol/L   CO2 27 22 - 32 mmol/L   Glucose, Bld 109 (H) 70 - 99 mg/dL   BUN 12 6 - 20 mg/dL   Creatinine, Ser 1.61 0.44 - 1.00 mg/dL   Calcium 09.6 (H) 8.9 - 10.3 mg/dL   Total Protein 8.1 6.5 - 8.1 g/dL   Albumin 4.6 3.5 - 5.0 g/dL   AST 18 15 - 41 U/L   ALT 19 0 - 44 U/L   Alkaline Phosphatase 73 38 - 126 U/L   Total Bilirubin 0.6 0.3 - 1.2 mg/dL   GFR calc non Af Amer >60 >60 mL/min   GFR calc Af Amer >60 >60 mL/min   Anion gap 9 5 - 15    Comment: Performed at [facility], [address] [place], [place] [address]  Lipase, blood     Status: None   Collection Time: 12/10/18  7:55 PM  Result Value Ref Range   Lipase 51 11 - 51 U/L    Comment: Performed at [facility], [address] [place], [place] [address]  Urinalysis, Routine w reflex microscopic     Status: Abnormal   Collection Time: 12/10/18  7:55 PM  Result Value Ref Range   Color, Urine YELLOW YELLOW   APPearance CLEAR  CLEAR   Specific Gravity, Urine >1.030 (H) 1.005 - 1.030   pH 7.0 5.0 - 8.0   Glucose, UA NEGATIVE NEGATIVE mg/dL   Hgb urine dipstick SMALL (A) NEGATIVE   Bilirubin Urine NEGATIVE NEGATIVE   Ketones, ur NEGATIVE NEGATIVE mg/dL   Protein, ur 30 (A) NEGATIVE mg/dL   Nitrite NEGATIVE NEGATIVE   Leukocytes,Ua NEGATIVE NEGATIVE    Comment: Performed at [facility], [address] [place], [place] [address]  Pregnancy, urine     Status: None   Collection Time: 12/10/18  7:55 PM  Result Value Ref Range   Preg Test, Ur NEGATIVE NEGATIVE    Comment:        THE SENSITIVITY OF THIS METHODOLOGY IS >20 mIU/mL. Performed at [facility], [address] [place], [place] [address]   Troponin I (High Sensitivity)     Status: None   Collection Time: 12/10/18  7:55 PM  Result Value Ref Range   Troponin I (High Sensitivity) <2 <18 ng/L    Comment: (NOTE) Elevated high sensitivity troponin I (hsTnI) values and significant  changes across serial measurements may suggest ACS but many other  chronic and acute conditions are known to elevate hsTnI results.  Refer to the Links section for chest pain algorithms and additional  guidance. Performed at [facility], [address] [place], [place] [address]   Urinalysis, Microscopic (reflex)     Status: Abnormal   Collection Time: 12/10/18  7:55 PM  Result Value Ref Range   RBC / HPF 6-10 0 - 5 RBC/hpf   WBC, UA NONE SEEN 0 - 5 WBC/hpf   Bacteria, UA RARE (A) NONE SEEN   Squamous Epithelial / LPF 6-10 0 - 5    Comment: Performed at [facility], [address] [place], [place] [address]   US Abdomen Limited Ruq  Result Date: 12/10/2018 CLINICAL DATA:  Right upper quadrant abdominal pain EXAM: ULTRASOUND ABDOMEN LIMITED RIGHT UPPER QUADRANT COMPARISON:  None. FINDINGS: Gallbladder: Multiple gallstones are noted. While there is  no gallbladder wall thickening, the gallbladder is distended and the  sonographic Murphy sign is positive. There is no evidence for pericholecystic free fluid. Gallbladder sludge is noted. Common bile duct: Diameter: 5 mm. Liver: No focal lesion identified. Within normal limits in parenchymal echogenicity. Portal vein is patent on color Doppler imaging with normal direction of blood flow towards the liver. Other: None. IMPRESSION: Overall findings consistent with acute calculus cholecystitis in the appropriate clinical setting. Electronically Signed   By: Katherine Mantlehristopher  Green M.D.   On: 12/10/2018 20:05    Pending Labs Unresulted Labs (From admission, onward)    Start     Ordered   12/18/18 0500  Creatinine, serum  (enoxaparin (LOVENOX)    CrCl >/= 30 ml/min)  Weekly,   R    Comments: while on enoxaparin therapy    12/11/18 0119   12/11/18 0117  HIV Antibody (routine testing w rflx)  (HIV Antibody (Routine testing w reflex) panel)  Once,   STAT     12/11/18 0119   12/10/18 2253  SARS CORONAVIRUS 2 (TAT 6-24 HRS) Nasopharyngeal Nasopharyngeal Swab  (Asymptomatic/Tier 3)  Once,   STAT    Question Answer Comment  Is this test for diagnosis or screening Screening   Symptomatic for COVID-19 as defined by CDC No   Hospitalized for COVID-19 No   Admitted to ICU for COVID-19 No   Previously tested for COVID-19 No   Resident in a congregate (group) care setting Unknown   Employed in healthcare setting Unknown   Pregnant Unknown      12/10/18 2252          Vitals/Pain Today's Vitals   12/10/18 2306 12/10/18 2338 12/11/18 0044 12/11/18 0204  BP: (!) 149/79  (!) 154/91 138/80  Pulse: 81  69 62  Resp: 16  16 15   Temp:    98.4 F (36.9 C)  TempSrc:    Oral  SpO2: 99%  95% 94%  Weight:      Height:      PainSc:  3       Isolation Precautions No active isolations  Medications Medications  Chlorhexidine Gluconate Cloth 2 % PADS 6 each (has no administration in time range)    And  Chlorhexidine Gluconate Cloth 2 % PADS 6 each (has no administration in  time range)  acetaminophen (TYLENOL) tablet 1,000 mg (has no administration in time range)  bupivacaine liposome (EXPAREL) 1.3 % injection 266 mg (has no administration in time range)  gabapentin (NEURONTIN) capsule 300 mg (has no administration in time range)  ceFAZolin (ANCEF) IVPB 2g/100 mL premix (has no administration in time range)    And  metroNIDAZOLE (FLAGYL) IVPB 500 mg (has no administration in time range)  celecoxib (CELEBREX) capsule 200 mg (has no administration in time range)  enoxaparin (LOVENOX) injection 40 mg (has no administration in time range)  0.9 %  sodium chloride infusion (has no administration in time range)  lactated ringers infusion (has no administration in time range)  acetaminophen (TYLENOL) tablet 650 mg (has no administration in time range)    Or  acetaminophen (TYLENOL) suppository 650 mg (has no administration in time range)  HYDROmorphone (DILAUDID) injection 0.5-2 mg (has no administration in time range)  bisacodyl (DULCOLAX) suppository 10 mg (has no administration in time range)  ondansetron (ZOFRAN-ODT) disintegrating tablet 4 mg (has no administration in time range)    Or  ondansetron (ZOFRAN) injection 4 mg (has no administration in time range)  simethicone (MYLICON) chewable tablet  40 mg (has no administration in time range)  metoprolol tartrate (LOPRESSOR) injection 5 mg (has no administration in time range)  cefTRIAXone (ROCEPHIN) 2 g in sodium chloride 0.9 % 100 mL IVPB (has no administration in time range)  lactated ringers bolus 1,000 mL (has no administration in time range)  lactated ringers bolus 1,000 mL (has no administration in time range)  methocarbamol (ROBAXIN) 1,000 mg in dextrose 5 % 100 mL IVPB (has no administration in time range)  prochlorperazine (COMPAZINE) injection 5-10 mg (has no administration in time range)  lip balm (CARMEX) ointment 1 application (has no administration in time range)  magic mouthwash (has no  administration in time range)  guaiFENesin-dextromethorphan (ROBITUSSIN DM) 100-10 MG/5ML syrup 10 mL (has no administration in time range)  hydrocortisone (ANUSOL-HC) 2.5 % rectal cream 1 application (has no administration in time range)  alum & mag hydroxide-simeth (MAALOX/MYLANTA) 200-200-20 MG/5ML suspension 30 mL (has no administration in time range)  hydrocortisone cream 1 % 1 application (has no administration in time range)  menthol-cetylpyridinium (CEPACOL) lozenge 3 mg (has no administration in time range)  phenol (CHLORASEPTIC) mouth spray 1-2 spray (has no administration in time range)  diphenhydrAMINE (BENADRYL) injection 12.5-25 mg (has no administration in time range)  enalaprilat (VASOTEC) injection 0.625-1.25 mg (has no administration in time range)  LORazepam (ATIVAN) injection 0.5-1 mg (has no administration in time range)  phentermine capsule 37.5 mg (has no administration in time range)  citalopram (CELEXA) tablet 40 mg (has no administration in time range)  ALPRAZolam (XANAX) tablet 0.5 mg (has no administration in time range)  SUMAtriptan (IMITREX) tablet 100 mg (has no administration in time range)  famotidine (PEPCID) IVPB 20 mg premix (0 mg Intravenous Stopped 12/10/18 2042)  ondansetron (ZOFRAN) injection 4 mg (4 mg Intravenous Given 12/10/18 2006)  morphine 4 MG/ML injection 4 mg (4 mg Intravenous Given 12/10/18 2008)  sodium chloride 0.9 % bolus 500 mL (0 mLs Intravenous Stopped 12/10/18 2043)  cefTRIAXone (ROCEPHIN) 2 g in sodium chloride 0.9 % 100 mL IVPB (0 g Intravenous Stopped 12/10/18 2116)  oxyCODONE-acetaminophen (PERCOCET/ROXICET) 5-325 MG per tablet 1 tablet (1 tablet Oral Given 12/10/18 2119)  morphine 4 MG/ML injection 4 mg (4 mg Intravenous Given 12/10/18 2303)  ondansetron (ZOFRAN) injection 4 mg (4 mg Intravenous Given 12/10/18 2302)    Mobility walks

## 2018-12-11 NOTE — Op Note (Signed)
Procedure Note  Pre-operative Diagnosis:  Acute cholecystitis, cholelithiasis, unrelenting biliary colic  Post-operative Diagnosis:  same  Surgeon:  Armandina Gemma, MD  Assistant:  Autumn Messing, MD   Procedure:  Laparoscopic cholecystectomy with intra-operative cholangiography  Anesthesia:  General  Estimated Blood Loss:  minimal  Drains: none         Specimen: gallbladder to pathology  Indications:  Per Dr. Johney Maine - "woman who awoke with epigastric and lower chest pain.  Persistent.  Became more intense.  Worse in the upper abdomen.  Worse right greater than left.  Felt some radiation to her back as well.  Decreased appetite and bloating.  Had some nausea but not to the point of emesis.  Never had anything like this before.  Based concerns went to urgent care center.  Recommendation made to go to emergency room for evaluation.  Went to [place] emergency department.  Negative cardiac or pulmonary work-up initially concern for biliary colic since seemed to improve with narcotics.  Underwent ultrasound that showed possible cholecystitis with Murphy sign.  Patient's pain worsened requiring repeat narcotics and has not abated all day.  Therefore, surgical consultation requested."  Patient now comes to surgery for cholecystectomy.  Procedure Details:  The patient was seen in the pre-op holding area. The risks, benefits, complications, treatment options, and expected outcomes were previously discussed with the patient. The patient agreed with the proposed plan and has signed the informed consent form.  The patient was transported to operating room # 1 at the [facility]. The patient was placed in the supine position on the operating room table. Following induction of general anesthesia, the abdomen was prepped and draped in the usual aseptic fashion.  An incision was made in the skin near the umbilicus. The midline fascia was incised and the peritoneal cavity was entered and a  Hasson cannula was introduced under direct vision. The cannula was secured with a 0-Vicryl pursestring suture. Pneumoperitoneum was established with carbon dioxide. Additional cannulae were introduced under direct vision along the right costal margin in the midline, mid-clavicular line, and anterior axillary line.   The gallbladder was identified and the fundus grasped and retracted cephalad. Gallbladder was markedly thickened and edematous.  It was aspirated and contained "milk bile". Adhesions were taken down bluntly and the electrocautery was utilized as needed, taking care not to involve any adjacent structures. The infundibulum was grasped and retracted laterally, exposing the peritoneum overlying the triangle of Calot. The peritoneum was incised and structures exposed with blunt dissection. The cystic duct was clearly identified, bluntly dissected circumferentially, and clipped at the neck of the gallbladder.  An incision was made in the cystic duct and the cholangiogram catheter introduced. The catheter was secured using an ligaclip.  Real-time cholangiography was performed using C-arm fluoroscopy.  There was rapid filling of a normal caliber common bile duct.  There was reflux of contrast into the left and right hepatic ductal systems.  There was free flow distally into the duodenum without filling defect or obstruction.  The catheter was removed from the peritoneal cavity.  The cystic duct was then ligated with ligaclips and divided. The cystic artery was identified, dissected circumferentially, ligated with ligaclips, and divided.  The gallbladder was dissected away from the gallbladder bed using the electrocautery for hemostasis. The gallbladder was completely removed from the liver and placed into an endocatch bag. The gallbladder was removed in the endocatch bag through the umbilical port site and submitted to pathology for  review.  The right upper quadrant was irrigated and the gallbladder  bed was inspected. Hemostasis was achieved with the electrocautery.  Cannulae were removed under direct vision and good hemostasis was noted. Pneumoperitoneum was released and the majority of the carbon dioxide evacuated. The umbilical wound was irrigated and the fascia was then closed with the pursestring suture.  Local anesthetic was infiltrated at all port sites. Skin incisions were closed with 4-0 Monocril subcuticular sutures and Dermabond was applied.  Instrument, sponge, and needle counts were correct at the conclusion of the case.  The patient was awakened from anesthesia and brought to the recovery room in stable condition.  The patient tolerated the procedure well.   [name], MD [facility] Office: [phone]

## 2018-12-11 NOTE — Transfer of Care (Signed)
Immediate Anesthesia Transfer of Care Note  Patient: Chloe Conley  Procedure(s) Performed: LAPAROSCOPIC CHOLECYSTECTOMY WITH INTRAOPERATIVE CHOLANGIOGRAM (N/A Abdomen)  Patient Location: PACU  Anesthesia Type:General  Level of Consciousness: sedated  Airway & Oxygen Therapy: Patient Spontanous Breathing and Patient connected to face mask oxygen  Post-op Assessment: Report given to RN and Post -op Vital signs reviewed and stable  Post vital signs: Reviewed and stable  Last Vitals:  Vitals Value Taken Time  BP 132/70 12/11/18 1057  Temp    Pulse 87 12/11/18 1059  Resp 12 12/11/18 1059  SpO2 100 % 12/11/18 1059  Vitals shown include unvalidated device data.  Last Pain:  Vitals:   12/11/18 0621  TempSrc: Oral  PainSc:          Complications: No apparent anesthesia complications

## 2018-12-11 NOTE — Anesthesia Procedure Notes (Signed)
Date/Time: 12/11/2018 10:51 AM Performed by: Cynda Familia, CRNA Oxygen Delivery Method: Simple face mask Placement Confirmation: positive ETCO2 and breath sounds checked- equal and bilateral Dental Injury: Teeth and Oropharynx as per pre-operative assessment

## 2018-12-11 NOTE — Anesthesia Procedure Notes (Signed)
Procedure Name: Intubation Date/Time: 12/11/2018 9:35 AM Performed by: Cynda Familia, CRNA Pre-anesthesia Checklist: Patient identified, Emergency Drugs available, Suction available and Patient being monitored Patient Re-evaluated:Patient Re-evaluated prior to induction Oxygen Delivery Method: Circle System Utilized Preoxygenation: Pre-oxygenation with 100% oxygen Induction Type: IV induction Ventilation: Mask ventilation without difficulty Laryngoscope Size: Miller and 2 Grade View: Grade I Tube type: Oral Tube size: 7.0 mm Number of attempts: 1 Airway Equipment and Method: Stylet Placement Confirmation: ETT inserted through vocal cords under direct vision,  positive ETCO2 and breath sounds checked- equal and bilateral Secured at: 22 cm Tube secured with: Tape Dental Injury: Teeth and Oropharynx as per pre-operative assessment  Comments: Smooth IV inductin Rose-- intubation AM CRNA atraumatic-- teeth and mouth as preop-- bilat BS Rose

## 2018-12-11 NOTE — Progress Notes (Signed)
Assessment & Plan: Acute calculous cholecystitis  Patient seen and scheduled for surgery this morning by Dr. Neysa Bonito  Covid testing collected but not performed at [place] last night  Now awaiting rapid test ordered by nursing supervisor  Patient remains mildly symptomatic and wishes to proceed today  OR aware and waiting  The risks and benefits of the procedure have been discussed at length with the patient.  The patient understands the proposed procedure, potential alternative treatments, and the course of recovery to be expected.  All of the patient's questions have been answered at this time.  The patient wishes to proceed with surgery.        Armandina Gemma, MD       [facility]       Office: [phone]   Chief Complaint: Abdominal pain  Subjective: Patient appears comfortable, pleasant.  Mild pain.  Objective: Vital signs in last 24 hours: Temp:  [97.4 F (36.3 C)-98.6 F (37 C)] 97.8 F (36.6 C) (11/26 0621) Pulse Rate:  [62-87] 69 (11/26 0621) Resp:  [15-18] 18 (11/26 0621) BP: (138-154)/(66-94) 140/71 (11/26 0621) SpO2:  [94 %-100 %] 96 % (11/26 0621) Weight:  [81.6 kg] 81.6 kg (11/25 1825) Last BM Date: 12/10/18  Intake/Output from previous day: 11/25 0701 - 11/26 0700 In: 1687.4 [I.V.:46.8; IV Piggyback:1640.7] Out: 800 [Urine:800] Intake/Output this shift: No intake/output data recorded.  Physical Exam: HEENT - sclerae clear, mucous membranes moist Neck - soft Chest - clear bilaterally Cor - RRR Abdomen - soft without distension; mild tenderness epigastrium Ext - no edema, non-tender Neuro - alert & oriented, no focal deficits  Lab Results:  Recent Labs    12/10/18 1955  WBC 10.2  HGB 14.7  HCT 44.1  PLT 313   BMET Recent Labs    12/10/18 1955  NA 135  K 3.7  CL 99  CO2 27  GLUCOSE 109*  BUN 12  CREATININE 0.95  CALCIUM 10.6*   PT/INR No results for input(s): LABPROT, INR in the last 72 hours.  Comprehensive Metabolic Panel:    Component Value Date/Time   NA 135 12/10/2018 1955   NA 140 06/22/2016 1109   K 3.7 12/10/2018 1955   K 4.2 06/22/2016 1109   CL 99 12/10/2018 1955   CL 104 06/22/2016 1109   CO2 27 12/10/2018 1955   CO2 29 06/22/2016 1109   BUN 12 12/10/2018 1955   BUN 15 06/22/2016 1109   CREATININE 0.95 12/10/2018 1955   CREATININE 0.87 06/22/2016 1109   GLUCOSE 109 (H) 12/10/2018 1955   GLUCOSE 86 06/22/2016 1109   CALCIUM 10.6 (H) 12/10/2018 1955   CALCIUM 9.6 06/22/2016 1109   AST 18 12/10/2018 1955   AST 11 06/22/2016 1109   ALT 19 12/10/2018 1955   ALT 11 06/22/2016 1109   ALKPHOS 73 12/10/2018 1955   ALKPHOS 53 06/22/2016 1109   BILITOT 0.6 12/10/2018 1955   BILITOT 0.4 06/22/2016 1109   PROT 8.1 12/10/2018 1955   PROT 7.1 06/22/2016 1109   ALBUMIN 4.6 12/10/2018 1955   ALBUMIN 4.4 06/22/2016 1109    Studies/Results: US Abdomen Limited Ruq  Result Date: 12/10/2018 CLINICAL DATA:  Right upper quadrant abdominal pain EXAM: ULTRASOUND ABDOMEN LIMITED RIGHT UPPER QUADRANT COMPARISON:  None. FINDINGS: Gallbladder: Multiple gallstones are noted. While there is no gallbladder wall thickening, the gallbladder is distended and the sonographic Chloe Conley sign is positive. There is no evidence for pericholecystic free fluid. Gallbladder sludge is noted.  Common bile duct: Diameter: 5 mm. Liver: No focal lesion identified. Within normal limits in parenchymal echogenicity. Portal vein is patent on color Doppler imaging with normal direction of blood flow towards the liver. Other: None. IMPRESSION: Overall findings consistent with acute calculus cholecystitis in the appropriate clinical setting. Electronically Signed   By: Chloe Conley M.D.   On: 12/10/2018 20:05      [name] 12/11/2018  Patient ID: Chloe Conley, female   DOB: Sep 06, 1970, 48 y.o.   MRN: [id number]

## 2018-12-12 MED ORDER — TRAMADOL HCL 50 MG PO TABS: 50.0000 mg | ORAL_TABLET | Freq: Four times a day (QID) | ORAL | 0 refills | Status: DC | PRN

## 2018-12-12 NOTE — Discharge Summary (Signed)
Physician Discharge Summary [facility]  Patient ID: Chloe Conley MRN: [id number] DOB/AGE: [birth year] 50 y.o.  Admit date: 12/10/2018 Discharge date: 12/12/2018  Admission Diagnoses:  Acute cholecystitis, cholelithiasis  Discharge Diagnoses:  Principal Problem:   Acute calculous cholecystitis Active Problems:   Anxiety state   Irritable bowel syndrome   Discharged Condition: good  Hospital Course: Patient was admitted for observation following gallbladder surgery.  Post op course was uncomplicated.  Pain was well controlled.  Tolerated diet.  Patient was prepared for discharge home on POD#1.  Consults: None  Treatments: surgery: lap chole with IOC  Discharge Exam: Blood pressure 125/71, pulse 69, temperature 98.2 F (36.8 C), temperature source Oral, resp. rate 15, height 5\' 6"  (1.676 m), weight 81.6 kg, last menstrual period 11/13/2018, SpO2 100 %. HEENT - clear Neck - soft Chest - clear bilaterally Cor - RRR Abd - wounds dry and intact, mild ecchymosis; Dermabond in place  Disposition: Home  Discharge Instructions    Diet - low sodium heart healthy   Complete by: As directed    Discharge instructions   Complete by: As directed    [facility]  LAPAROSCOPIC SURGERY:  POST-OP INSTRUCTIONS  Always review your discharge instruction sheet given to you by the facility where your surgery was performed.  A prescription for pain medication may be given to you upon discharge.  Take your pain medication as prescribed.  If narcotic pain medicine is not needed, then you may take acetaminophen (Tylenol) or ibuprofen (Advil) as needed.  Take your usually prescribed medications unless otherwise directed.  If you need a refill on your pain medication, please contact your pharmacy.  They will contact our office to request authorization. Prescriptions will not be filled after 5 P.M. or on weekends.  You should follow a light diet the first  few days after arrival home, such as soup and crackers or toast.  Be sure to include plenty of fluids daily.  Most patients will experience some swelling and bruising in the area of the incisions.  Ice packs will help.  Swelling and bruising can take several days to resolve.   It is common to experience some constipation after surgery.  Increasing fluid intake and taking a stool softener (such as Colace) will usually help or prevent this problem from occurring.  A mild laxative (Milk of Magnesia or Miralax) should be taken according to package instructions if there has been no bowel movement after 48 hours.  You will likely have Dermabond (topical glue) over your incisions.  This seals the incisions and allows you to bathe and shower at any time after your surgery.  Glue should remain in place for up to 10 days.  It may be removed after 10 days by pealing off the Dermabond material or using Vaseline or naval jelly to remove.  If you have steri-strips over your incisions, you may remove the gauze bandage on the second day after surgery, and you may shower at that time.  Leave your steri-strips (small skin tapes) in place directly over the incision.  These strips should remain on the skin for 5-7 days and then be removed.  You may get them wet in the shower and pat them dry.  Any sutures or staples will be removed at the office during your follow-up visit.  ACTIVITIES:  You may resume regular (light) daily activities beginning the next day - such as daily self-care, walking, climbing stairs - gradually increasing activities  as tolerated.  You may have sexual intercourse when it is comfortable.  Refrain from any heavy lifting or straining until approved by your doctor.  You may drive when you are no longer taking prescription pain medication, when you can comfortably wear a seatbelt, and when you can safely maneuver your car and apply brakes.  You should see your doctor in the office for a follow-up  appointment approximately 2-3 weeks after your surgery.  Make sure that you call for this appointment within a day or two after you arrive home to insure a convenient appointment time.  WHEN TO CALL YOUR DOCTOR: Fever over 101.0 Inability to urinate Continued bleeding from incision Increased pain, redness, or drainage from the incision Increasing abdominal pain  The clinic staff is available to answer your questions during regular business hours.  Please don't hesitate to call and ask to speak to one of the nurses for clinical concerns.  If you have a medical emergency, go to the nearest emergency room or call 911.  A surgeon from [facility] is always on call for the hospital.  Velora Heckler, MD, [facility] Office: [phone] Toll Free:  [phone] FAX [phone]  Website: [email or web address]   Increase activity slowly   Complete by: As directed    No dressing needed   Complete by: As directed      Allergies as of 12/12/2018   No Known Allergies     Medication List    TAKE these medications   citalopram 40 MG tablet Commonly known as: CELEXA TAKE 1 TABLET BY MOUTH EVERY DAY   multivitamin with minerals Tabs tablet Take 1 tablet by mouth daily.   SUMAtriptan 100 MG tablet Commonly known as: IMITREX TAKE 1 TABLET BY MOUTH AS NEEDED FOR HEADACHE MAY REPEAT IN 2 HOURS IF NEEDED What changed:   how much to take  how to take this  when to take this  reasons to take this  additional instructions   traMADol 50 MG tablet Commonly known as: ULTRAM Take 1-2 tablets (50-100 mg total) by mouth every 6 (six) hours as needed.      Follow-up Information    Go to  [facility].   Why: Go directly to the emergency department for further evaluation Contact information: [address] [place] [address] [phone]       [facility] [place]. Schedule an  appointment as soon as possible for a visit in 2 week(s).   Specialty: General Surgery Contact information: [address] [place] [address] [phone]          Velora Heckler, MD, [facility] Office: [phone]   Signed: [name] 12/12/2018, 8:13 AM

## 2018-12-12 NOTE — Progress Notes (Signed)
D/C instructions given to patient. Patient had no questions. NT or writer will wheel patient out once her family comes in  

## 2018-12-13 NOTE — Anesthesia Postprocedure Evaluation (Signed)
Anesthesia Post Note  Patient: Chloe Conley  Procedure(s) Performed: LAPAROSCOPIC CHOLECYSTECTOMY WITH INTRAOPERATIVE CHOLANGIOGRAM (N/A Abdomen)     Patient location during evaluation: PACU Anesthesia Type: General Level of consciousness: awake and alert Pain management: pain level controlled Vital Signs Assessment: post-procedure vital signs reviewed and stable Respiratory status: spontaneous breathing, nonlabored ventilation, respiratory function stable and patient connected to nasal cannula oxygen Cardiovascular status: blood pressure returned to baseline and stable Postop Assessment: no apparent nausea or vomiting Anesthetic complications: no    Last Vitals:  Vitals:   12/11/18 2024 12/12/18 0607  BP: 124/66 125/71  Pulse: 70 69  Resp: 14 15  Temp: 37 C 36.8 C  SpO2: 95% 100%    Last Pain:  Vitals:   12/12/18 0845  TempSrc:   PainSc: 0-No pain                 Molina Hollenback S

## 2018-12-14 ENCOUNTER — Encounter (HOSPITAL_COMMUNITY): Payer: Self-pay | Admitting: Surgery

## 2018-12-15 LAB — SURGICAL PATHOLOGY

## 2019-03-10 ENCOUNTER — Encounter (HOSPITAL_BASED_OUTPATIENT_CLINIC_OR_DEPARTMENT_OTHER): Payer: Self-pay | Admitting: Emergency Medicine

## 2019-03-10 ENCOUNTER — Emergency Department (HOSPITAL_BASED_OUTPATIENT_CLINIC_OR_DEPARTMENT_OTHER): Payer: BC Managed Care – PPO

## 2019-03-10 ENCOUNTER — Other Ambulatory Visit: Payer: Self-pay

## 2019-03-10 ENCOUNTER — Emergency Department (HOSPITAL_BASED_OUTPATIENT_CLINIC_OR_DEPARTMENT_OTHER)
Admission: EM | Admit: 2019-03-10 | Discharge: 2019-03-10 | Disposition: A | Discharge status: Home / Self Care | Payer: BC Managed Care – PPO | Attending: Emergency Medicine | Admitting: Emergency Medicine

## 2019-03-10 DIAGNOSIS — K6389 Other specified diseases of intestine: Secondary | ICD-10-CM

## 2019-03-10 DIAGNOSIS — R1012 Left upper quadrant pain: Secondary | ICD-10-CM | POA: Diagnosis present

## 2019-03-10 DIAGNOSIS — K589 Irritable bowel syndrome without diarrhea: Secondary | ICD-10-CM | POA: Insufficient documentation

## 2019-03-10 DIAGNOSIS — K388 Other specified diseases of appendix: Secondary | ICD-10-CM | POA: Insufficient documentation

## 2019-03-10 DIAGNOSIS — Z79899 Other long term (current) drug therapy: Secondary | ICD-10-CM | POA: Diagnosis not present

## 2019-03-10 LAB — URINALYSIS, MICROSCOPIC (REFLEX)

## 2019-03-10 LAB — CBC WITH DIFFERENTIAL/PLATELET
Abs Immature Granulocytes: 0.03 10*3/uL (ref 0.00–0.07)
Basophils Absolute: 0.1 10*3/uL (ref 0.0–0.1)
Basophils Relative: 1 %
Eosinophils Absolute: 0.3 10*3/uL (ref 0.0–0.5)
Eosinophils Relative: 4 %
HCT: 40.8 % (ref 36.0–46.0)
Hemoglobin: 13.6 g/dL (ref 12.0–15.0)
Immature Granulocytes: 0 %
Lymphocytes Relative: 46 %
Lymphs Abs: 4 10*3/uL (ref 0.7–4.0)
MCH: 30.6 pg (ref 26.0–34.0)
MCHC: 33.3 g/dL (ref 30.0–36.0)
MCV: 91.7 fL (ref 80.0–100.0)
Monocytes Absolute: 0.7 10*3/uL (ref 0.1–1.0)
Monocytes Relative: 8 %
Neutro Abs: 3.5 10*3/uL (ref 1.7–7.7)
Neutrophils Relative %: 41 %
Platelets: 243 10*3/uL (ref 150–400)
RBC: 4.45 MIL/uL (ref 3.87–5.11)
RDW: 11.9 % (ref 11.5–15.5)
WBC: 8.6 10*3/uL (ref 4.0–10.5)
nRBC: 0 % (ref 0.0–0.2)

## 2019-03-10 LAB — COMPREHENSIVE METABOLIC PANEL
ALT: 21 U/L (ref 0–44)
AST: 18 U/L (ref 15–41)
Albumin: 3.9 g/dL (ref 3.5–5.0)
Alkaline Phosphatase: 72 U/L (ref 38–126)
Anion gap: 9 (ref 5–15)
BUN: 20 mg/dL (ref 6–20)
CO2: 25 mmol/L (ref 22–32)
Calcium: 9.3 mg/dL (ref 8.9–10.3)
Chloride: 104 mmol/L (ref 98–111)
Creatinine, Ser: 0.95 mg/dL (ref 0.44–1.00)
GFR calc Af Amer: >60 mL/min (ref >60)
GFR calc non Af Amer: >60 mL/min (ref >60)
Glucose, Bld: 102 mg/dL — ABNORMAL HIGH (ref 70–99)
Potassium: 3.8 mmol/L (ref 3.5–5.1)
Sodium: 138 mmol/L (ref 135–145)
Total Bilirubin: 0.4 mg/dL (ref 0.3–1.2)
Total Protein: 7.2 g/dL (ref 6.5–8.1)

## 2019-03-10 LAB — URINALYSIS, ROUTINE W REFLEX MICROSCOPIC
Bilirubin Urine: NEGATIVE
Glucose, UA: NEGATIVE mg/dL
Ketones, ur: NEGATIVE mg/dL
Leukocytes,Ua: NEGATIVE
Nitrite: NEGATIVE
Protein, ur: NEGATIVE mg/dL
Specific Gravity, Urine: >1.03 — ABNORMAL HIGH (ref 1.005–1.030)
pH: 6 (ref 5.0–8.0)

## 2019-03-10 LAB — PREGNANCY, URINE: Preg Test, Ur: NEGATIVE

## 2019-03-10 MED ORDER — HYDROMORPHONE HCL 1 MG/ML IJ SOLN: 0.5000 mg | Freq: Once | INTRAMUSCULAR | Status: DC

## 2019-03-10 MED ORDER — LACTATED RINGERS IV BOLUS
1000.0000 mL | Freq: Once | INTRAVENOUS | Status: AC
  Administered 2019-03-10: 1000 mL via INTRAVENOUS

## 2019-03-10 MED ORDER — IOHEXOL 300 MG/ML  SOLN
100.0000 mL | Freq: Once | INTRAMUSCULAR | Status: AC | PRN
  Administered 2019-03-10: 100 mL via INTRAVENOUS

## 2019-03-10 MED ORDER — KETOROLAC TROMETHAMINE 30 MG/ML IJ SOLN
15.0000 mg | Freq: Once | INTRAMUSCULAR | Status: AC
  Administered 2019-03-10: 15 mg via INTRAVENOUS
  Filled 2019-03-10: qty 1

## 2019-03-10 NOTE — ED Provider Notes (Signed)
[facility] EMERGENCY DEPARTMENT Provider Note   CSN: [id number] Arrival date & time: 03/10/19  0257     History Chief Complaint  Patient presents with  . Abdominal Pain    Chloe Conley is a 49 y.o. female.   Abdominal Pain Pain location:  LUQ Pain quality: aching and sharp   Pain radiates to:  Does not radiate Pain severity:  Mild Timing:  Constant Chronicity:  New Context: not alcohol use   Relieved by:  None tried Ineffective treatments:  None tried Associated symptoms: nausea   Associated symptoms: no anorexia, no chills, no constipation, no cough, no diarrhea, no fever, no flatus and no hematemesis   Risk factors: no alcohol abuse, not elderly, no NSAID use, not obese and not pregnant        Past Medical History:  Diagnosis Date  . Allergy   . Anxiety   . Frequent PVCs    sees Dr. Sherryl Manges   . Headache(784.0)   . IBS (irritable bowel syndrome)   . Routine gynecological examination    sees Dr. Waynard Reeds  . RVOT ventricular tachycardia (HCC) 2015    Patient Active Problem List   Diagnosis Date Noted  . Acute calculous cholecystitis 12/10/2018  . Migraines 08/24/2015  . 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 Irregular heartbeat 02/03/2013  . RVOT ventricular tachycardia (HCC) 2015  . FATIGUE 03/31/2008  . Anxiety state 10/15/2007  . Irritable bowel syndrome 10/15/2007  . WEIGHT GAIN 03/31/2007  . ALLERGIC RHINITIS 10/11/2006  . Headache 10/11/2006    Past Surgical History:  Procedure Laterality Date  . BREAST ENHANCEMENT SURGERY    . CESAREAN SECTION  04/30/2005   Dr Edward Jolly  . CHOLECYSTECTOMY N/A 12/11/2018   Procedure: LAPAROSCOPIC CHOLECYSTECTOMY WITH INTRAOPERATIVE CHOLANGIOGRAM;  Surgeon: [name], MD;  Location: [facility];  Service: General;  Laterality: N/A;  . COLONOSCOPY  01/01/2013   per Dr. Loreta Ave, clear, repeat at age 29   . DIAGNOSTIC LAPAROSCOPY     For IVF/Fertility   . DILATION  AND EVACUATION  05/14/2003  . REDUCTION MAMMAPLASTY Bilateral    bilat lift  . RHINOPLASTY    . SEPTOPLASTY       OB History   No obstetric history on file.     Family History  Problem Relation Age of Onset  . Diabetes Other   . Migraines Other     Social History   Tobacco Use  . Smoking status: Never Smoker  . Smokeless tobacco: Never Used  Substance Use Topics  . Alcohol use: No    Alcohol/week: 0.0 standard drinks  . Drug use: No    Home Medications Prior to Admission medications   Medication Sig Start Date End Date Taking? Authorizing Provider  citalopram (CELEXA) 40 MG tablet TAKE 1 TABLET BY MOUTH EVERY DAY Patient taking differently: Take 40 mg by mouth daily.  08/08/18  Yes Nelwyn Salisbury, MD  Multiple Vitamin (MULTIVITAMIN WITH MINERALS) TABS tablet Take 1 tablet by mouth daily.   Yes [provider]  SUMAtriptan (IMITREX) 100 MG tablet TAKE 1 TABLET BY MOUTH AS NEEDED FOR HEADACHE MAY REPEAT IN 2 HOURS IF NEEDED Patient taking differently: Take 100 mg by mouth every 2 (two) hours as needed for migraine. MAY REPEAT IN 2 HOURS IF NEEDED 11/10/18  Yes Nelwyn Salisbury, MD    Allergies    Patient has no known allergies.  Review of Systems   Review of Systems  Constitutional: Negative for chills and fever.  Respiratory: Negative for cough.   Gastrointestinal: Positive for abdominal pain and nausea. Negative for anorexia, constipation, diarrhea, flatus and hematemesis.  All other systems reviewed and are negative.   Physical Exam Updated Vital Signs BP 137/81   Pulse 63   Temp 97.6 F (36.4 C) (Oral)   Resp 16   Ht 5' 6.5" (1.689 m)   Wt 83.9 kg   SpO2 98%   BMI 29.41 kg/m   Physical Exam Vitals and nursing note reviewed.  Constitutional:      Appearance: She is well-developed.  HENT:     Head: Normocephalic and atraumatic.     Mouth/Throat:     Mouth: Mucous membranes are moist.  Eyes:     Conjunctiva/sclera: Conjunctivae normal.      Pupils: Pupils are equal, round, and reactive to light.  Cardiovascular:     Rate and Rhythm: Normal rate and regular rhythm.  Pulmonary:     Effort: Pulmonary effort is normal. No respiratory distress.     Breath sounds: No stridor.  Abdominal:     General: There is no distension.     Tenderness: There is abdominal tenderness (general mid left abdominal area). There is no guarding or rebound.  Musculoskeletal:        General: No swelling or tenderness. Normal range of motion.     Cervical back: Normal range of motion.  Skin:    General: Skin is warm and dry.  Neurological:     General: No focal deficit present.     Mental Status: She is alert.     ED Results / Procedures / Treatments   Labs (all labs ordered are listed, but only abnormal results are displayed) Labs Reviewed  URINALYSIS, ROUTINE W REFLEX MICROSCOPIC - Abnormal; Notable for the following components:      Result Value   Specific Gravity, Urine >1.030 (*)    Hgb urine dipstick SMALL (*)    All other components within normal limits  COMPREHENSIVE METABOLIC PANEL - Abnormal; Notable for the following components:   Glucose, Bld 102 (*)    All other components within normal limits  URINALYSIS, MICROSCOPIC (REFLEX) - Abnormal; Notable for the following components:   Bacteria, UA FEW (*)    All other components within normal limits  PREGNANCY, URINE  CBC WITH DIFFERENTIAL/PLATELET    EKG None  Radiology CT ABDOMEN PELVIS W CONTRAST  Result Date: 03/10/2019 CLINICAL DATA:  Left abdominal pain with tenderness for 2 days. EXAM: CT ABDOMEN AND PELVIS WITH CONTRAST TECHNIQUE: Multidetector CT imaging of the abdomen and pelvis was performed using the standard protocol following bolus administration of intravenous contrast. CONTRAST:  150mL OMNIPAQUE IOHEXOL 300 MG/ML  SOLN COMPARISON:  None. FINDINGS: Lower chest:  No contributory findings. Hepatobiliary: No focal liver abnormality.Cholecystectomy. Normal bile duct  diameter. Pancreas: Unremarkable. Spleen: Unremarkable. Adrenals/Urinary Tract: Negative adrenals. No hydronephrosis or stone. Renal sinus cysts on the left. Unremarkable bladder. Stomach/Bowel: Focal area of fat stranding along the anti mesenteric border of the distal transverse colon without underlying diverticulum. No underlying colonic wall thickening. No bowel obstruction. No appendicitis. Vascular/Lymphatic: No acute vascular abnormality. No mass or adenopathy. Reproductive:No pathologic findings. Other: No ascites or pneumoperitoneum. Musculoskeletal: No acute abnormalities. IMPRESSION: Findings of epiploic appendagitis along the distal transverse colon. Electronically Signed   By: Monte Fantasia M.D.   On: 03/10/2019 04:45    Procedures Procedures (including critical care time)  Medications Ordered in ED Medications  HYDROmorphone (DILAUDID)  injection 0.5 mg (0.5 mg Intravenous Not Given 03/10/19 0355)  lactated ringers bolus 1,000 mL ( Intravenous Stopped 03/10/19 0453)  iohexol (OMNIPAQUE) 300 MG/ML solution 100 mL (100 mLs Intravenous Contrast Given 03/10/19 0416)  ketorolac (TORADOL) 30 MG/ML injection 15 mg (15 mg Intravenous Given 03/10/19 1610)    ED Course  I have reviewed the triage vital signs and the nursing notes.  Pertinent labs & imaging results that were available during my care of the patient were reviewed by me and considered in my medical decision making (see chart for details).    MDM Rules/Calculators/A&P                      Epiploic appendagitis without other acute abnormalities. No indication for antibiotics or further workup/treatment at this time.  Final Clinical Impression(s) / ED Diagnoses Final diagnoses:  Epiploic appendagitis    Rx / DC Orders ED Discharge Orders    None       Trichelle Lehan, Barbara Cower, MD 03/10/19 [phone]

## 2019-03-10 NOTE — ED Triage Notes (Signed)
Pt c/o left sided abd pain with tenderness. Denies any N/V/D

## 2019-03-25 ENCOUNTER — Other Ambulatory Visit: Payer: Self-pay | Admitting: Family Medicine

## 2019-03-26 NOTE — Telephone Encounter (Signed)
Patient need to schedule an ov for more refills. 

## 2019-03-27 ENCOUNTER — Telehealth (INDEPENDENT_AMBULATORY_CARE_PROVIDER_SITE_OTHER): Payer: BC Managed Care – PPO | Admitting: Family Medicine

## 2019-03-27 ENCOUNTER — Other Ambulatory Visit: Payer: Self-pay

## 2019-03-27 DIAGNOSIS — F411 Generalized anxiety disorder: Secondary | ICD-10-CM

## 2019-03-27 MED ORDER — CITALOPRAM HYDROBROMIDE 40 MG PO TABS: 40.0000 mg | ORAL_TABLET | Freq: Every day | ORAL | 3 refills | Status: DC

## 2019-03-27 NOTE — Progress Notes (Signed)
Virtual Visit via Video Note  I connected with the patient on 03/27/19 at 11:30 AM EST by a video enabled telemedicine application and verified that I am speaking with the correct person using two identifiers.  Location patient: home Location provider:work or home office Persons participating in the virtual visit: patient, provider  I discussed the limitations of evaluation and management by telemedicine and the availability of in person appointments. The patient expressed understanding and agreed to proceed.   HPI: Here to follow up on anxiety. She has been doing well and the Celexa has been working well for her. She sleeps well. She and her husband are both on low carb diets to lose weight.    ROS: See pertinent positives and negatives per HPI.  Past Medical History:  Diagnosis Date  . Allergy   . Anxiety   . Frequent PVCs    sees Dr. Sherryl Manges   . Headache(784.0)   . IBS (irritable bowel syndrome)   . Routine gynecological examination    sees Dr. Waynard Reeds  . RVOT ventricular tachycardia (HCC) 2015    Past Surgical History:  Procedure Laterality Date  . BREAST ENHANCEMENT SURGERY    . CESAREAN SECTION  04/30/2005   Dr Edward Jolly  . CHOLECYSTECTOMY N/A 12/11/2018   Procedure: LAPAROSCOPIC CHOLECYSTECTOMY WITH INTRAOPERATIVE CHOLANGIOGRAM;  Surgeon: [name], MD;  Location: [facility];  Service: General;  Laterality: N/A;  . COLONOSCOPY  01/01/2013   per Dr. Loreta Ave, clear, repeat at age 66   . DIAGNOSTIC LAPAROSCOPY     For IVF/Fertility   . DILATION AND EVACUATION  05/14/2003  . REDUCTION MAMMAPLASTY Bilateral    bilat lift  . RHINOPLASTY    . SEPTOPLASTY      Family History  Problem Relation Age of Onset  . Diabetes Other   . Migraines Other      Current Outpatient Medications:  .  citalopram (CELEXA) 40 MG tablet, Take 1 tablet (40 mg total) by mouth daily., Disp: 90 tablet, Rfl: 3 .  Influenza Virus Vaccine Split SUSP, Afluria 2013-2014 45 mcg (15 mcg x  3)/0.5 mL intramuscular suspension  TO BE ADMINISTERED BY THE PHARMACIST, Disp: , Rfl:  .  Multiple Vitamin (MULTIVITAMIN WITH MINERALS) TABS tablet, Take 1 tablet by mouth daily., Disp: , Rfl:  .  polyethylene glycol (GOLYTELY) 236 g solution, peg 3350-electrolytes 236 gram-22.74 gram-6.74 gram-5.86 gram solution  TAKE AS DIRECTED, Disp: , Rfl:  .  SUMAtriptan (IMITREX) 100 MG tablet, TAKE 1 TABLET BY MOUTH AS NEEDED FOR HEADACHE MAY REPEAT IN 2 HOURS IF NEEDED (Patient taking differently: Take 100 mg by mouth every 2 (two) hours as needed for migraine. MAY REPEAT IN 2 HOURS IF NEEDED), Disp: 15 tablet, Rfl: 6  EXAM:  VITALS per patient if applicable:  GENERAL: alert, oriented, appears well and in no acute distress  HEENT: atraumatic, conjunttiva clear, no obvious abnormalities on inspection of external nose and ears  NECK: normal movements of the head and neck  LUNGS: on inspection no signs of respiratory distress, breathing rate appears normal, no obvious gross SOB, gasping or wheezing  CV: no obvious cyanosis  MS: moves all visible extremities without noticeable abnormality  PSYCH/NEURO: pleasant and cooperative, no obvious depression or anxiety, speech and thought processing grossly intact  ASSESSMENT AND PLAN: Anxiety, stable. Celexa was refilled.  Gershon Crane, MD  Discussed the following assessment and plan:  Anxiety state     I discussed the assessment and treatment plan with the patient. The  patient was provided an opportunity to ask questions and all were answered. The patient agreed with the plan and demonstrated an understanding of the instructions.   The patient was advised to call back or seek an in-person evaluation if the symptoms worsen or if the condition fails to improve as anticipated.

## 2019-05-22 DIAGNOSIS — D1801 Hemangioma of skin and subcutaneous tissue: Secondary | ICD-10-CM | POA: Diagnosis not present

## 2019-05-22 DIAGNOSIS — D2230 Melanocytic nevi of unspecified part of face: Secondary | ICD-10-CM | POA: Diagnosis not present

## 2019-05-22 DIAGNOSIS — D225 Melanocytic nevi of trunk: Secondary | ICD-10-CM | POA: Diagnosis not present

## 2019-05-22 DIAGNOSIS — D485 Neoplasm of uncertain behavior of skin: Secondary | ICD-10-CM | POA: Diagnosis not present

## 2019-05-22 DIAGNOSIS — D2361 Other benign neoplasm of skin of right upper limb, including shoulder: Secondary | ICD-10-CM | POA: Diagnosis not present

## 2019-05-22 DIAGNOSIS — L821 Other seborrheic keratosis: Secondary | ICD-10-CM | POA: Diagnosis not present

## 2019-07-01 ENCOUNTER — Encounter: Payer: Self-pay | Admitting: Plastic Surgery

## 2019-07-01 ENCOUNTER — Ambulatory Visit: Payer: BC Managed Care – PPO | Admitting: Plastic Surgery

## 2019-07-01 ENCOUNTER — Other Ambulatory Visit: Payer: Self-pay

## 2019-07-01 VITALS — BP 128/79 | HR 65 | Temp 97.5°F | Ht 66.5 in | Wt 171.2 lb

## 2019-07-01 DIAGNOSIS — L989 Disorder of the skin and subcutaneous tissue, unspecified: Secondary | ICD-10-CM

## 2019-07-01 NOTE — Progress Notes (Signed)
Referring Provider Nelwyn Salisbury, MD [address] [place],  [place] [address]   CC:  Chief Complaint  Patient presents with  . Advice Only      Chloe Conley is an 49 y.o. female.  HPI: Patient presents out of concern for 2 lesions on her forehead.  They been present for a while but tend to fluctuate in size.  There is one right at the medial aspect of her right eyebrow and one higher up on her forehead to the left of midline.  She wants to make sure that they are not anything cancerous.  No Known Allergies  Outpatient Encounter Medications as of 07/01/2019  Medication Sig  . citalopram (CELEXA) 40 MG tablet Take 1 tablet (40 mg total) by mouth daily.  . SUMAtriptan (IMITREX) 100 MG tablet TAKE 1 TABLET BY MOUTH AS NEEDED FOR HEADACHE MAY REPEAT IN 2 HOURS IF NEEDED (Patient taking differently: Take 100 mg by mouth every 2 (two) hours as needed for migraine. MAY REPEAT IN 2 HOURS IF NEEDED)  . Influenza Virus Vaccine Split SUSP Afluria 2013-2014 45 mcg (15 mcg x 3)/0.5 mL intramuscular suspension  TO BE ADMINISTERED BY THE PHARMACIST (Patient not taking: Reported on 07/01/2019)  . Multiple Vitamin (MULTIVITAMIN WITH MINERALS) TABS tablet Take 1 tablet by mouth daily. (Patient not taking: Reported on 07/01/2019)  . polyethylene glycol (GOLYTELY) 236 g solution peg 3350-electrolytes 236 gram-22.74 gram-6.74 gram-5.86 gram solution  TAKE AS DIRECTED (Patient not taking: Reported on 07/01/2019)   No facility-administered encounter medications on file as of 07/01/2019.     Past Medical History:  Diagnosis Date  . Allergy   . Anxiety   . Frequent PVCs    sees Dr. Sherryl Manges   . Headache(784.0)   . IBS (irritable bowel syndrome)   . Routine gynecological examination    sees Dr. Waynard Reeds  . RVOT ventricular tachycardia (HCC) 2015    Past Surgical History:  Procedure Laterality Date  . BREAST ENHANCEMENT SURGERY    . CESAREAN SECTION  04/30/2005   Dr Edward Jolly  .  CHOLECYSTECTOMY N/A 12/11/2018   Procedure: LAPAROSCOPIC CHOLECYSTECTOMY WITH INTRAOPERATIVE CHOLANGIOGRAM;  Surgeon: [name], MD;  Location: [facility];  Service: General;  Laterality: N/A;  . COLONOSCOPY  01/01/2013   per Dr. Loreta Ave, clear, repeat at age 62   . DIAGNOSTIC LAPAROSCOPY     For IVF/Fertility   . DILATION AND EVACUATION  05/14/2003  . REDUCTION MAMMAPLASTY Bilateral    bilat lift  . RHINOPLASTY    . SEPTOPLASTY      Family History  Problem Relation Age of Onset  . Diabetes Other   . Migraines Other     Social History   Social History Narrative  . Not on file     Review of Systems General: Denies fevers, chills, weight loss CV: Denies chest pain, shortness of breath, palpitations  Physical Exam Vitals with BMI 07/01/2019 03/10/2019 03/10/2019  Height 5' 6.5" - -  Weight 171 lbs 3 oz - -  BMI 27.22 - -  Systolic 128 137 621  Diastolic 79 81 84  Pulse 65 63 74    General:  No acute distress,  Alert and oriented, Non-Toxic, Normal speech and affect Examination shows 2 papules on the forehead each 2 to 3 mm in size.  1 in the right medial eyebrow area and 1 in the left upper forehead.  They both have benign appearance.  I suspect an intradermal nevus.  Assessment/Plan  Patient presents with 2 papules on the forehead that are fluctuating in size.  I recommended shave biopsy in both areas.  We discussed the risks include bleeding, infection and need for additional procedures and scarring.  We will plan to set this up under local.  All of her questions were answered.  Cindra Presume 07/01/2019, 4:13 PM

## 2019-07-07 DIAGNOSIS — Z1211 Encounter for screening for malignant neoplasm of colon: Secondary | ICD-10-CM | POA: Diagnosis not present

## 2019-07-07 DIAGNOSIS — K625 Hemorrhage of anus and rectum: Secondary | ICD-10-CM | POA: Diagnosis not present

## 2019-08-12 ENCOUNTER — Encounter: Payer: Self-pay | Admitting: Plastic Surgery

## 2019-08-12 ENCOUNTER — Ambulatory Visit: Payer: BC Managed Care – PPO | Admitting: Plastic Surgery

## 2019-08-12 ENCOUNTER — Other Ambulatory Visit (HOSPITAL_COMMUNITY)
Admission: RE | Admit: 2019-08-12 | Discharge: 2019-08-12 | Disposition: A | Discharge status: Home / Self Care | Payer: BC Managed Care – PPO | Source: Ambulatory Visit | Attending: Plastic Surgery | Admitting: Plastic Surgery

## 2019-08-12 ENCOUNTER — Other Ambulatory Visit: Payer: Self-pay

## 2019-08-12 VITALS — BP 128/72 | HR 65 | Temp 98.2°F

## 2019-08-12 DIAGNOSIS — D2239 Melanocytic nevi of other parts of face: Secondary | ICD-10-CM | POA: Diagnosis not present

## 2019-08-12 DIAGNOSIS — L989 Disorder of the skin and subcutaneous tissue, unspecified: Secondary | ICD-10-CM

## 2019-08-12 NOTE — Progress Notes (Signed)
Operative Note   DATE OF OPERATION: 08/12/2019  LOCATION:    SURGICAL DEPARTMENT: Plastic Surgery  PREOPERATIVE DIAGNOSES:  Skin lesions  POSTOPERATIVE DIAGNOSES:  same  PROCEDURE:  1. 70mm shave excisions of left cheek, left forehead, and right eyebrow skin lesions  SURGEON: Ancil Linsey, MD  ANESTHESIA:  Local  COMPLICATIONS: None.   INDICATIONS FOR PROCEDURE:  The patient, Chloe Conley is a 49 y.o. female born on Jun 25, 1970, is here for treatment of multiple skin lesions MRN: [id number]  CONSENT:  Informed consent was obtained directly from the patient. Risks, benefits and alternatives were fully discussed. Specific risks including but not limited to bleeding, infection, hematoma, seroma, scarring, pain, infection, wound healing problems, and need for further surgery were all discussed. The patient did have an ample opportunity to have questions answered to satisfaction.   DESCRIPTION OF PROCEDURE:  Local anesthesia was administered. The patient's operative site was prepped and draped in a sterile fashion. A time out was performed and all information was confirmed to be correct.  The lesions were excised with a 15 blade shave.  Hemostasis was obtained with pressure.  Ointment and bandaids were applied.  The patient tolerated the procedure well.  There were no complications.

## 2019-08-17 LAB — SURGICAL PATHOLOGY

## 2019-10-07 DIAGNOSIS — R0981 Nasal congestion: Secondary | ICD-10-CM | POA: Diagnosis not present

## 2019-10-07 DIAGNOSIS — Z20822 Contact with and (suspected) exposure to covid-19: Secondary | ICD-10-CM | POA: Diagnosis not present

## 2019-10-08 ENCOUNTER — Other Ambulatory Visit: Payer: Self-pay | Admitting: Adult Health

## 2019-10-08 DIAGNOSIS — Z20822 Contact with and (suspected) exposure to covid-19: Secondary | ICD-10-CM | POA: Diagnosis not present

## 2019-10-08 DIAGNOSIS — U071 COVID-19: Secondary | ICD-10-CM | POA: Diagnosis not present

## 2019-10-08 NOTE — Progress Notes (Signed)
I connected by phone with Chloe Conley on 10/08/2019 at 8:22 PM to discuss the potential use of a new treatment for mild to moderate COVID-19 viral infection in non-hospitalized patients.  This patient is a 49 y.o. female that meets the FDA criteria for Emergency Use Authorization of COVID monoclonal antibody casirivimab/imdevimab.  Has a (+) direct SARS-CoV-2 viral test result  Has mild or moderate COVID-19   Is NOT hospitalized due to COVID-19  Is within 10 days of symptom onset  Has at least one of the high risk factor(s) for progression to severe COVID-19 and/or hospitalization as defined in EUA.  Specific high risk criteria : BMI > 25   I have spoken and communicated the following to the patient or parent/caregiver regarding COVID monoclonal antibody treatment:  1. FDA has authorized the emergency use for the treatment of mild to moderate COVID-19 in adults and pediatric patients with positive results of direct SARS-CoV-2 viral testing who are 59 years of age and older weighing at least 40 kg, and who are at high risk for progressing to severe COVID-19 and/or hospitalization.  2. The significant known and potential risks and benefits of COVID monoclonal antibody, and the extent to which such potential risks and benefits are unknown.  3. Information on available alternative treatments and the risks and benefits of those alternatives, including clinical trials.  4. Patients treated with COVID monoclonal antibody should continue to self-isolate and use infection control measures (e.g., wear mask, isolate, social distance, avoid sharing personal items, clean and disinfect "high touch" surfaces, and frequent handwashing) according to CDC guidelines.   5. The patient or parent/caregiver has the option to accept or refuse COVID monoclonal antibody treatment.  After reviewing this information with the patient, The patient agreed to proceed with receiving casirivimab\imdevimab infusion and  will be provided a copy of the Fact sheet prior to receiving the infusion.  Set up for 10/09/19 at 1330  Will bring copy of test results   [name] 10/08/2019 8:22 PM

## 2019-10-09 ENCOUNTER — Ambulatory Visit (HOSPITAL_COMMUNITY)
Admission: RE | Admit: 2019-10-09 | Discharge: 2019-10-09 | Disposition: A | Discharge status: Home / Self Care | Payer: BC Managed Care – PPO | Source: Ambulatory Visit | Attending: Pulmonary Disease | Admitting: Pulmonary Disease

## 2019-10-09 ENCOUNTER — Other Ambulatory Visit (HOSPITAL_COMMUNITY): Payer: Self-pay

## 2019-10-09 DIAGNOSIS — U071 COVID-19: Secondary | ICD-10-CM | POA: Diagnosis not present

## 2019-10-09 MED ORDER — SODIUM CHLORIDE 0.9 % IV SOLN
1200.0000 mg | Freq: Once | INTRAVENOUS | Status: AC
  Administered 2019-10-09: 1200 mg via INTRAVENOUS

## 2019-10-09 MED ORDER — EPINEPHRINE 0.3 MG/0.3ML IJ SOAJ: 0.3000 mg | Freq: Once | INTRAMUSCULAR | Status: DC | PRN

## 2019-10-09 MED ORDER — DIPHENHYDRAMINE HCL 50 MG/ML IJ SOLN: 50.0000 mg | Freq: Once | INTRAMUSCULAR | Status: DC | PRN

## 2019-10-09 MED ORDER — FAMOTIDINE IN NACL 20-0.9 MG/50ML-% IV SOLN: 20.0000 mg | Freq: Once | INTRAVENOUS | Status: DC | PRN

## 2019-10-09 MED ORDER — METHYLPREDNISOLONE SODIUM SUCC 125 MG IJ SOLR: 125.0000 mg | Freq: Once | INTRAMUSCULAR | Status: DC | PRN

## 2019-10-09 MED ORDER — SODIUM CHLORIDE 0.9 % IV SOLN: INTRAVENOUS | Status: DC | PRN

## 2019-10-09 MED ORDER — ALBUTEROL SULFATE HFA 108 (90 BASE) MCG/ACT IN AERS: 2.0000 | INHALATION_SPRAY | Freq: Once | RESPIRATORY_TRACT | Status: DC | PRN

## 2019-10-09 NOTE — Progress Notes (Signed)
  Diagnosis: COVID-19  Physician: Patrick Wright, MD  Procedure: [facility] Med: casirivimab\imdevimab infusion - Provided patient with casirivimab\imdevimab fact sheet for patients, parents and caregivers prior to infusion.  Complications: No immediate complications noted.  Discharge: Discharged home   Chloe Conley N Chloe Conley 10/09/2019   

## 2019-10-09 NOTE — Discharge Instructions (Signed)

## 2019-10-09 NOTE — Progress Notes (Signed)
  Diagnosis: COVID-19  Physician: Wright, MD  Procedure: [facility]fusion [facility] Med: casirivimab\imdevimab infusion - Provided patient with casirivimab\imdevimab fact sheet for patients, parents and caregivers prior to infusion.  Complications: No immediate complications noted.  Discharge: Discharged home   Chloe Conley R Chloe Conley 10/09/2019   

## 2019-10-19 DIAGNOSIS — Z03818 Encounter for observation for suspected exposure to other biological agents ruled out: Secondary | ICD-10-CM | POA: Diagnosis not present

## 2019-10-19 DIAGNOSIS — Z20822 Contact with and (suspected) exposure to covid-19: Secondary | ICD-10-CM | POA: Diagnosis not present

## 2019-11-03 ENCOUNTER — Other Ambulatory Visit: Payer: Self-pay | Admitting: Obstetrics and Gynecology

## 2019-11-03 DIAGNOSIS — Z1231 Encounter for screening mammogram for malignant neoplasm of breast: Secondary | ICD-10-CM

## 2019-11-16 ENCOUNTER — Other Ambulatory Visit: Payer: Self-pay | Admitting: Family Medicine

## 2019-12-17 DIAGNOSIS — Z01419 Encounter for gynecological examination (general) (routine) without abnormal findings: Secondary | ICD-10-CM | POA: Diagnosis not present

## 2019-12-17 DIAGNOSIS — Z124 Encounter for screening for malignant neoplasm of cervix: Secondary | ICD-10-CM | POA: Diagnosis not present

## 2019-12-18 ENCOUNTER — Ambulatory Visit
Admission: RE | Admit: 2019-12-18 | Discharge: 2019-12-18 | Disposition: A | Discharge status: Home / Self Care | Payer: BC Managed Care – PPO | Source: Ambulatory Visit | Attending: Obstetrics and Gynecology | Admitting: Obstetrics and Gynecology

## 2019-12-18 ENCOUNTER — Other Ambulatory Visit: Payer: Self-pay

## 2019-12-18 DIAGNOSIS — Z1231 Encounter for screening mammogram for malignant neoplasm of breast: Secondary | ICD-10-CM

## 2020-05-08 ENCOUNTER — Other Ambulatory Visit: Payer: Self-pay | Admitting: Family Medicine

## 2020-05-20 ENCOUNTER — Ambulatory Visit (INDEPENDENT_AMBULATORY_CARE_PROVIDER_SITE_OTHER): Payer: BC Managed Care – PPO | Admitting: Family Medicine

## 2020-05-20 ENCOUNTER — Other Ambulatory Visit: Payer: Self-pay

## 2020-05-20 ENCOUNTER — Encounter: Payer: Self-pay | Admitting: Family Medicine

## 2020-05-20 VITALS — BP 128/74 | HR 65 | Temp 98.7°F | Ht 63.0 in | Wt 172.2 lb

## 2020-05-20 DIAGNOSIS — Z0000 Encounter for general adult medical examination without abnormal findings: Secondary | ICD-10-CM

## 2020-05-20 NOTE — Progress Notes (Signed)
   Subjective:    Patient ID: Chloe Conley, female    DOB: [birth year], 50 y.o.   MRN: [id number]  HPI Here for a well exam. She feels fine.    Review of Systems  Constitutional: Negative.   HENT: Negative.   Eyes: Negative.   Respiratory: Negative.   Cardiovascular: Negative.   Gastrointestinal: Negative.   Genitourinary: Negative for decreased urine volume, difficulty urinating, dyspareunia, dysuria, enuresis, flank pain, frequency, hematuria, pelvic pain and urgency.  Musculoskeletal: Negative.   Skin: Negative.   Neurological: Negative.   Psychiatric/Behavioral: Negative.        Objective:   Physical Exam Constitutional:      General: She is not in acute distress.    Appearance: She is well-developed.  HENT:     Head: Normocephalic and atraumatic.     Right Ear: External ear normal.     Left Ear: External ear normal.     Nose: Nose normal.     Mouth/Throat:     Pharynx: No oropharyngeal exudate.  Eyes:     General: No scleral icterus.    Conjunctiva/sclera: Conjunctivae normal.     Pupils: Pupils are equal, round, and reactive to light.  Neck:     Thyroid: No thyromegaly.     Vascular: No JVD.  Cardiovascular:     Rate and Rhythm: Normal rate and regular rhythm.     Heart sounds: Normal heart sounds. No murmur heard. No friction rub. No gallop.   Pulmonary:     Effort: Pulmonary effort is normal. No respiratory distress.     Breath sounds: Normal breath sounds. No wheezing or rales.  Chest:     Chest wall: No tenderness.  Abdominal:     General: Bowel sounds are normal. There is no distension.     Palpations: Abdomen is soft. There is no mass.     Tenderness: There is no abdominal tenderness. There is no guarding or rebound.  Musculoskeletal:        General: No tenderness. Normal range of motion.     Cervical back: Normal range of motion and neck supple.  Lymphadenopathy:     Cervical: No cervical adenopathy.  Skin:    General: Skin is warm and dry.      Findings: No erythema or rash.  Neurological:     Mental Status: She is alert and oriented to person, place, and time.     Cranial Nerves: No cranial nerve deficit.     Motor: No abnormal muscle tone.     Coordination: Coordination normal.     Deep Tendon Reflexes: Reflexes are normal and symmetric. Reflexes normal.  Psychiatric:        Behavior: Behavior normal.        Thought Content: Thought content normal.        Judgment: Judgment normal.           Assessment & Plan:  Well exam. We discussed diet and exercise. Get fasting labs soon.  Gershon Crane, MD

## 2020-05-27 ENCOUNTER — Other Ambulatory Visit: Payer: BC Managed Care – PPO

## 2020-09-20 ENCOUNTER — Other Ambulatory Visit: Payer: Self-pay | Admitting: Family Medicine

## 2021-01-10 ENCOUNTER — Other Ambulatory Visit: Payer: Self-pay | Admitting: Family Medicine

## 2021-02-24 ENCOUNTER — Other Ambulatory Visit: Payer: Self-pay

## 2021-02-24 ENCOUNTER — Encounter: Payer: Self-pay | Admitting: Family Medicine

## 2021-02-24 ENCOUNTER — Telehealth: Payer: Self-pay | Admitting: Family Medicine

## 2021-02-24 ENCOUNTER — Telehealth: Payer: BC Managed Care – PPO | Admitting: Family Medicine

## 2021-02-24 VITALS — Ht 63.0 in

## 2021-02-24 DIAGNOSIS — R051 Acute cough: Secondary | ICD-10-CM

## 2021-02-24 DIAGNOSIS — J069 Acute upper respiratory infection, unspecified: Secondary | ICD-10-CM

## 2021-02-24 MED ORDER — HYDROCODONE BIT-HOMATROP MBR 5-1.5 MG/5ML PO SOLN: 5.0000 mL | Freq: Two times a day (BID) | ORAL | 0 refills | Status: AC | PRN

## 2021-02-24 MED ORDER — FLUTICASONE PROPIONATE 50 MCG/ACT NA SUSP: 1.0000 | Freq: Two times a day (BID) | NASAL | 0 refills | Status: DC

## 2021-02-24 MED ORDER — BENZONATATE 100 MG PO CAPS: 100.0000 mg | ORAL_CAPSULE | Freq: Two times a day (BID) | ORAL | 0 refills | Status: AC | PRN

## 2021-02-24 NOTE — Telephone Encounter (Signed)
Patient calling in with respiratory symptoms: Shortness of breath, chest pain, palpitations or other red words send to Triage  Does the patient have a fever over 100, cough, congestion, sore throat, runny nose, lost of taste/smell (please list symptoms that patient has)? congestion, body aches and chills, sore throat and coughing alot, runny nose  What date did symptoms start? 1 week ago (If over 5 days ago, pt may be scheduled for in person visit)  Have you tested for Covid in the last 5 days? No   If yes, was it positive []  OR negative [] ? If positive in the last 5 days, please schedule virtual visit now. If negative, schedule for an in person OV with the next available provider if PCP has no openings. Please also let patient know they will be tested again (follow the script below)  "you will have to arrive prior to your appt time to be Covid tested. Please park in back of office at the cone & call [phone] to let the staff know you have arrived. A staff member will meet you at your car to do a rapid covid test. Once the test has resulted you will be notified by phone of your results to determine if appt will remain an in person visit or be converted to a virtual/phone visit. If you arrive less than before your appt time, your visit will be automatically converted to virtual & any recommended testing will happen AFTER the visit."   THINGS TO REMEMBER  If no availability for virtual visit in office,  please schedule another [place] office  If no availability at another [place] office, please instruct patient that they can schedule an evisit or virtual visit through their mychart account. Visits up to 8pm  patients can be seen in office 5 days after positive COVID test

## 2021-02-24 NOTE — Progress Notes (Signed)
Virtual Visit via Video Note I connected with Chloe Conley on 02/24/21 by a video enabled telemedicine application and verified that I am speaking with the correct person using two identifiers.  Location patient: home Location provider:work office Persons participating in the virtual visit: patient, provider  I discussed the limitations of evaluation and management by telemedicine and the availability of in person appointments. The patient expressed understanding and agreed to proceed.  Chief Complaint  Patient presents with   Generalized Body Aches    Coughing, pressure in head, running nose, sore throat, congestion, body aches chills,runny nose/symptoms for about a week Taking nyquil and ibprofen   HPI: Chloe Conley is a 51 yo female with hx of allergies, IBS, anxiety, and frequent unifocal PVCs complaining of 7-8 days of respiratory symptoms as described above. Initially she had non productive cough and sneezing. + Fatigue, decreased appetite,and postnasal drainage.  Productive cough with clearish sputum, negative for hemoptysis.  She is not sure about fever because does not have a thermometer. She has not noted anosmia but has has note decreased taste. Negative for stridor,CP, wheezing, SOB, palpitations, abdominal pain, nausea, vomiting, changes in bowel habits, urinary symptoms, or skin rash.  She has not done a COVID 19 test. No sick contact or recent travel.  Most symptoms have resolved but still having nasal congestion, rhinorrhea, and "lingering" cough. Cough is interfering with sleep.  ROS: See pertinent positives and negatives per HPI.  Past Medical History:  Diagnosis Date   Allergy    Anxiety    Frequent PVCs    sees Dr. Sherryl Manges    Headache(784.0)    IBS (irritable bowel syndrome)    Routine gynecological examination    sees Dr. Waynard Reeds   RVOT ventricular tachycardia 2015    Past Surgical History:  Procedure Laterality Date   BREAST ENHANCEMENT  SURGERY     CESAREAN SECTION  04/30/2005   Dr Edward Jolly   CHOLECYSTECTOMY N/A 12/11/2018   Procedure: LAPAROSCOPIC CHOLECYSTECTOMY WITH INTRAOPERATIVE CHOLANGIOGRAM;  Surgeon: [name], MD;  Location: [facility];  Service: General;  Laterality: N/A;   COLONOSCOPY  01/01/2013   per Dr. Loreta Ave, clear, repeat in 10 yrs    DIAGNOSTIC LAPAROSCOPY     For IVF/Fertility    DILATION AND EVACUATION  05/14/2003   REDUCTION MAMMAPLASTY Bilateral    bilat lift   RHINOPLASTY     SEPTOPLASTY      Family History  Problem Relation Age of Onset   Diabetes Other    Migraines Other     Social History   Socioeconomic History   Marital status: Married    Spouse name: Not on file   Number of children: Not on file   Years of education: Not on file   Highest education level: Not on file  Occupational History   Not on file  Tobacco Use   Smoking status: Never   Smokeless tobacco: Never  Substance and Sexual Activity   Alcohol use: No    Alcohol/week: 0.0 standard drinks   Drug use: No   Sexual activity: Not on file  Other Topics Concern   Not on file  Social History Narrative   Not on file   Social Determinants of Health   Financial Resource Strain: Not on file  Food Insecurity: Not on file  Transportation Needs: Not on file  Physical Activity: Not on file  Stress: Not on file  Social Connections: Not on file  Intimate Partner Violence: Not on file  Current Outpatient Medications:    citalopram (CELEXA) 40 MG tablet, TAKE 1 TABLET BY MOUTH EVERY DAY, Disp: 90 tablet, Rfl: 3   Influenza Virus Vaccine Split SUSP, Afluria 2013-2014 45 mcg (15 mcg x 3)/0.5 mL intramuscular suspension  TO BE ADMINISTERED BY THE PHARMACIST, Disp: , Rfl:    Multiple Vitamin (MULTIVITAMIN WITH MINERALS) TABS tablet, Take 1 tablet by mouth daily., Disp: , Rfl:    polyethylene glycol (GOLYTELY) 236 g solution, peg 3350-electrolytes 236 gram-22.74 gram-6.74 gram-5.86 gram solution  TAKE AS DIRECTED, Disp: , Rfl:     SUMAtriptan (IMITREX) 100 MG tablet, TAKE 1 TABLET BY MOUTH AS NEEDED FOR HEADACHE MAY REPEAT IN 2 HOURS IF NEEDED, Disp: 15 tablet, Rfl: 6  EXAM:  VITALS per patient if applicable:Ht 5\' 3"  (1.6 m)    BMI 30.50 kg/m   GENERAL: alert, oriented, appears well and in no acute distress  HEENT: atraumatic, conjunctiva clear, no obvious abnormalities on inspection of external nose and ears Nasal congestion.  NECK: normal movements of the head and neck  LUNGS: on inspection no signs of respiratory distress, breathing rate appears normal, no obvious gross SOB, gasping or wheezing  CV: no obvious cyanosis  MS: moves all visible extremities without noticeable abnormality  PSYCH/NEURO: pleasant and cooperative, no obvious depression or anxiety, speech and thought processing grossly intact  ASSESSMENT AND PLAN:  Discussed the following assessment and plan:  URI, acute Symptoms suggests a viral etiology, symptomatic treatment recommended. Some symptoms have resolved. Recommend Flonase nasal spray daily as needed and nasal saline irrigations for nasal congestion. Instructed to monitor for signs of complications, including new onset of fever among some, clearly instructed about warning signs. F/U as needed.  - fluticasone (FLONASE) 50 MCG/ACT nasal spray; Place 1 spray into both nostrils 2 (two) times daily.  Dispense: 16 g; Refill: 0  Acute cough I also explained that cough and nasal congestion can last a few days and sometimes weeks. I do not think imaging is needed at this time but if cough does not improve in 2 weeks, will consider chest x-ray. Benzonatate and Hycodan recommended for cough, some side effects discussed. Plain Mucinex may also help.  - HYDROcodone bit-homatropine (HYCODAN) 5-1.5 MG/5ML syrup; Take 5 mLs by mouth every 12 (twelve) hours as needed for up to 10 days for cough.  Dispense: 100 mL; Refill: 0 - benzonatate (TESSALON) 100 MG capsule; Take 1 capsule (100 mg  total) by mouth 2 (two) times daily as needed for up to 10 days.  Dispense: 20 capsule; Refill: 0  We discussed possible serious and likely etiologies, options for evaluation and workup, limitations of telemedicine visit vs in person visit, treatment, treatment risks and precautions. The patient was advised to call back or seek an in-person evaluation if the symptoms worsen or if the condition fails to improve as anticipated. I discussed the assessment and treatment plan with the patient. Chloe Conley was provided an opportunity to ask questions and all were answered. She agreed with the plan and demonstrated an understanding of the instructions.  Return if symptoms worsen or fail to improve.  Chloe Mathena Leavy Cella, MD

## 2021-02-24 NOTE — Telephone Encounter (Signed)
Noted  

## 2021-03-07 ENCOUNTER — Telehealth: Payer: Self-pay | Admitting: Family Medicine

## 2021-03-07 NOTE — Telephone Encounter (Signed)
Patient called to see if she could be prescribed LATISSE for her eyelashes. Patient states that she used to receive it from her dermatology office, but they no longer carry it. She states that she had eyelash extensions for years and she now has no lashes. Patient stated that if she needs an appointment she will come in but she wanted the message sent back first.    Please send to  [id number] - [place], [place] - [address] Phone:  [phone]  Fax:  [phone]       Please advise

## 2021-03-08 MED ORDER — BIMATOPROST 0.03 % EX SOLN: 1.0000 "application " | Freq: Every day | CUTANEOUS | 12 refills | Status: DC

## 2021-03-08 NOTE — Telephone Encounter (Signed)
I sent in the RX. No OV is needed

## 2021-03-08 NOTE — Telephone Encounter (Signed)
Called patient message given. 

## 2021-03-08 NOTE — Telephone Encounter (Signed)
Please advise 

## 2021-03-15 IMAGING — US US ABDOMEN LIMITED
1 series · 14 of 25 positions shown · non-contrast
Comparison: None.

CLINICAL DATA: Right upper quadrant abdominal pain

EXAM:
ULTRASOUND ABDOMEN LIMITED RIGHT UPPER QUADRANT

[Series 1: us abdomen limited · 14 of 103 slices shown]
[im 1/103]
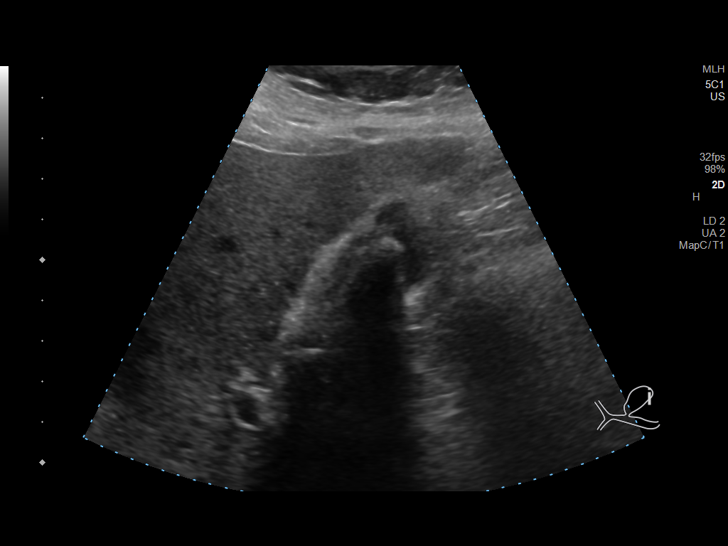
[im 9/103]
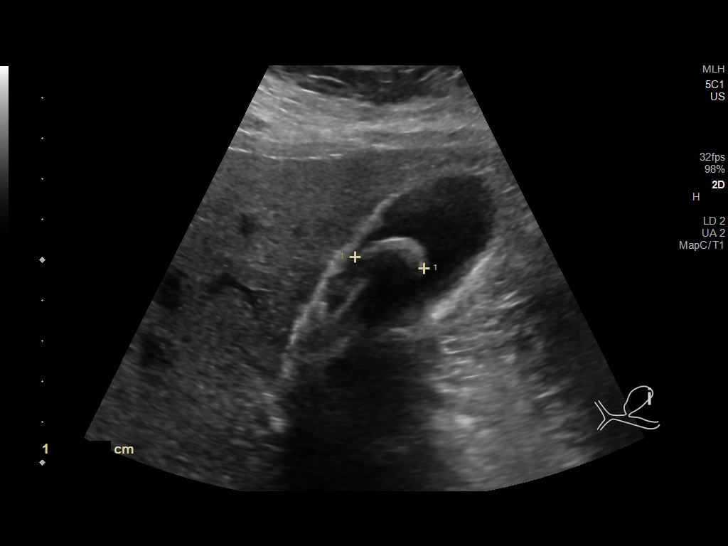
[im 18/103]
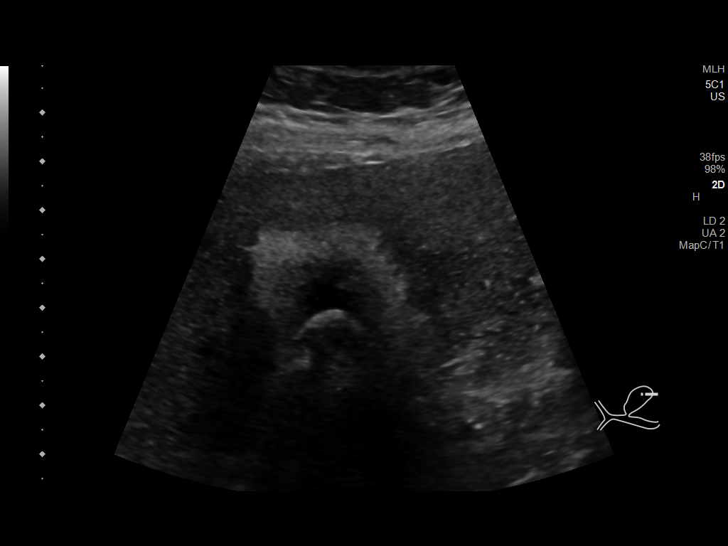
[im 26/103]
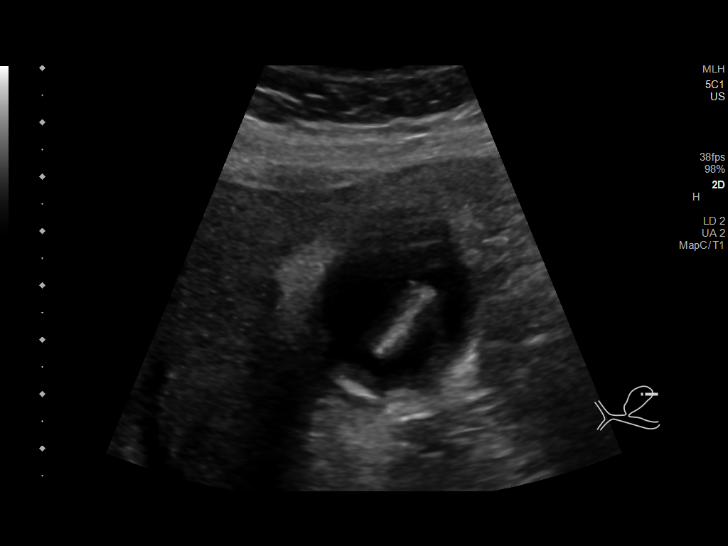
[im 35/103]
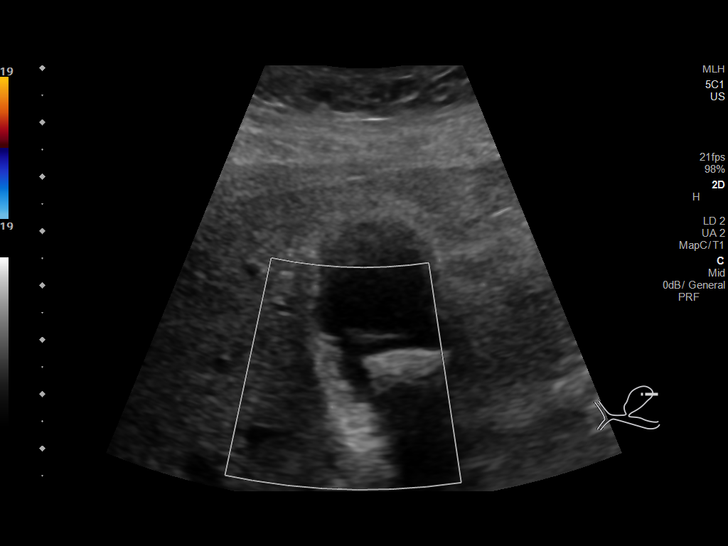
[im 39/103]
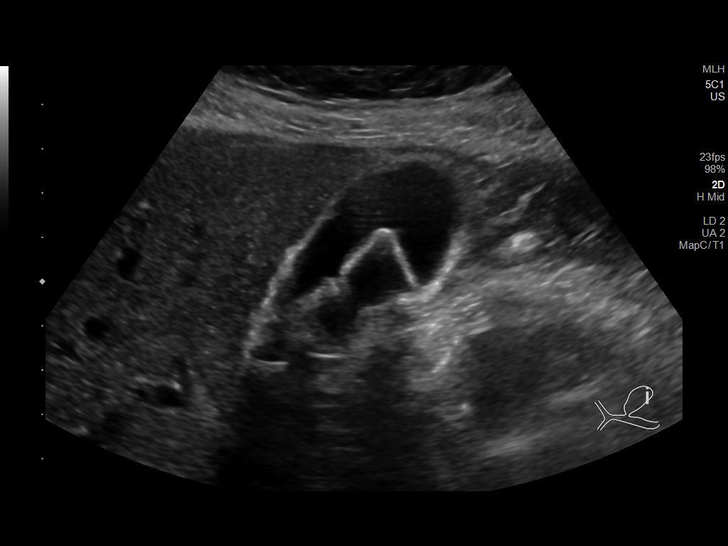
[im 47/103]
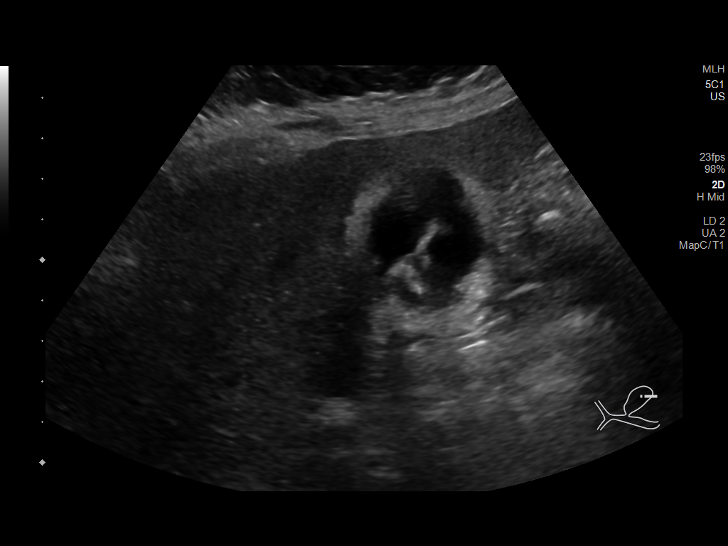
[im 56/103]
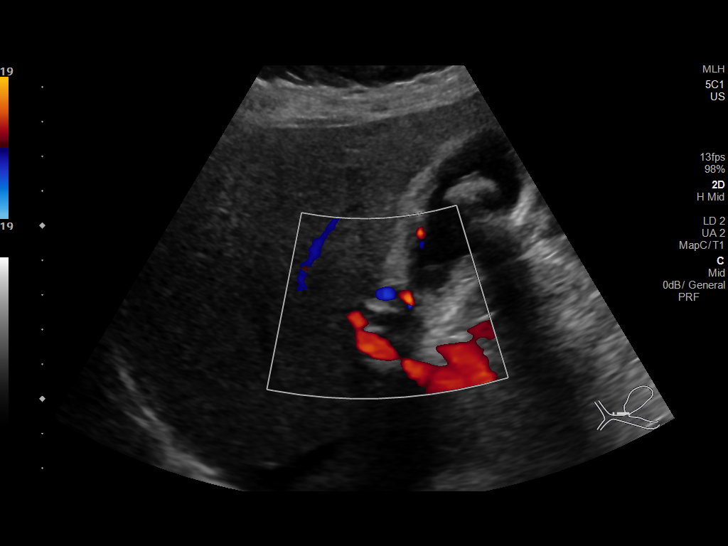
[im 64/103]
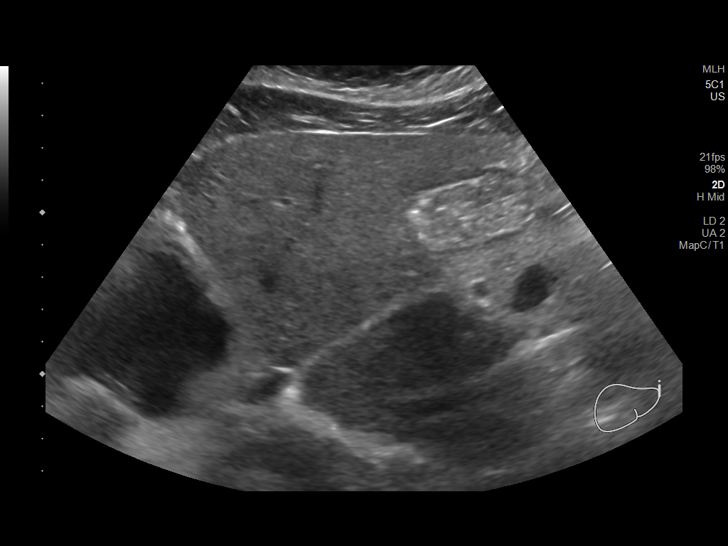
[im 69/103]
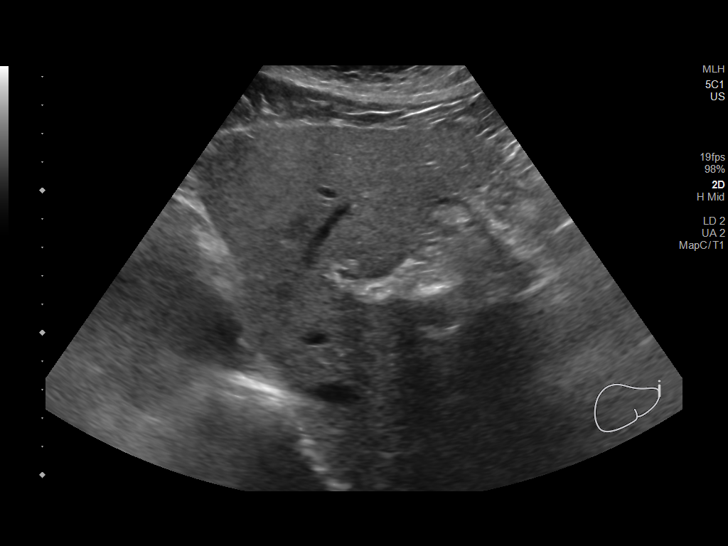
[im 77/103]
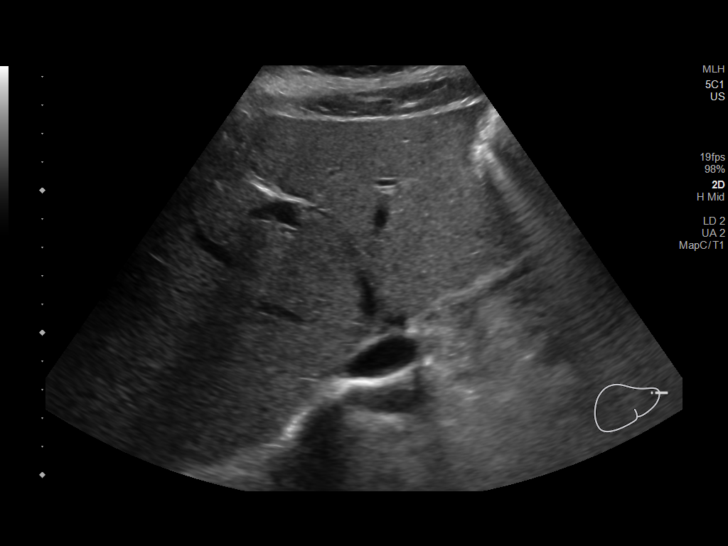
[im 86/103]
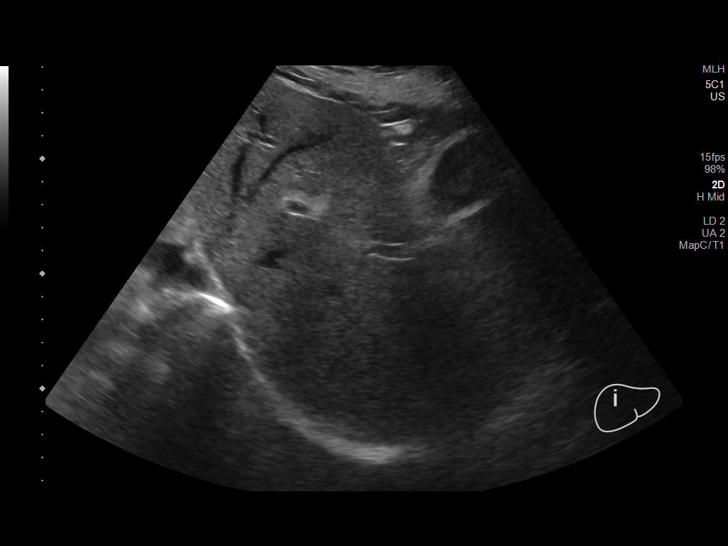
[im 94/103]
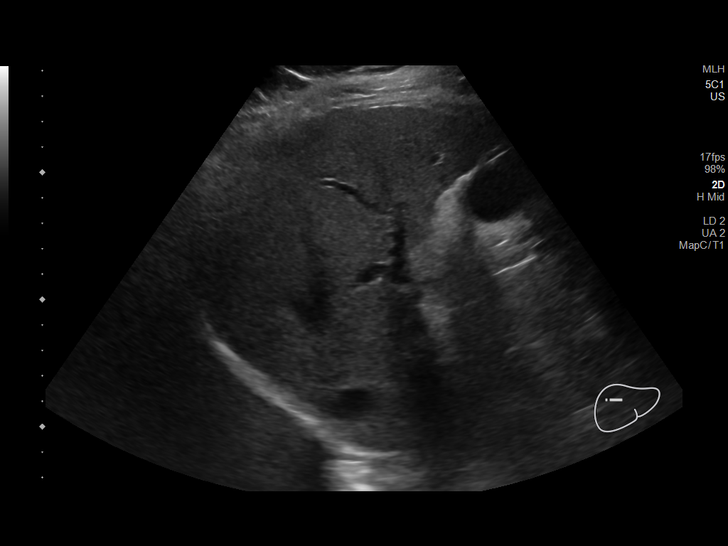
[im 103/103]
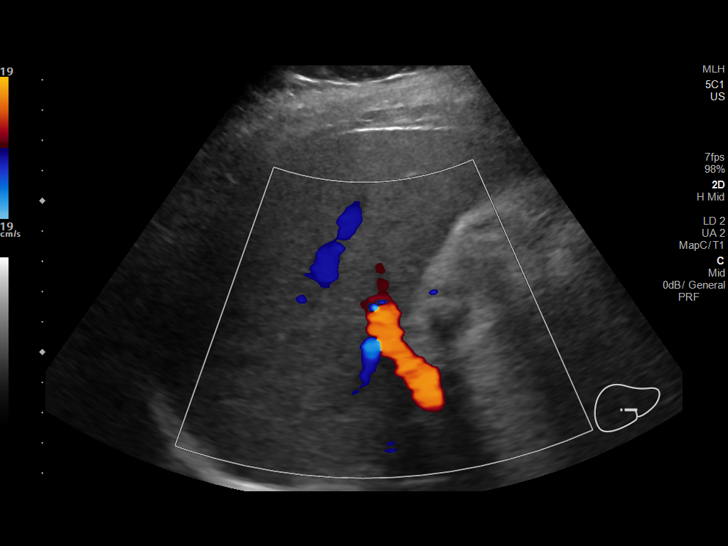

[14 of 25 positions shown; findings below may reference images not displayed]

FINDINGS: Gallbladder:

Multiple gallstones are noted. While there is no gallbladder wall
thickening, the gallbladder is distended and the sonographic Murphy
sign is positive. There is no evidence for pericholecystic free
fluid. Gallbladder sludge is noted.

Common bile duct:

Diameter: 5 mm.

Liver:

No focal lesion identified. Within normal limits in parenchymal
echogenicity. Portal vein is patent on color Doppler imaging with
normal direction of blood flow towards the liver.

Other: None.
IMPRESSION: Overall findings consistent with acute calculus cholecystitis in the
appropriate clinical setting.

## 2021-03-16 IMAGING — RF DG CHOLANGIOGRAM OPERATIVE
1 series · 8 of 8 positions shown · non-contrast
Comparison: Right upper quadrant abdominal ultrasound on 12/10/2018

CLINICAL DATA: Cholecystectomy for cholelithiasis and
cholecystitis.

EXAM:
INTRAOPERATIVE CHOLANGIOGRAM
TECHNIQUE: Cholangiographic images from the C-arm fluoroscopic device were
submitted for interpretation post-operatively. Please see the
procedural report for the amount of contrast and the fluoroscopy
time utilized.

[Series 1: run · 2 acquisitions, 8 frames shown]
[im 1/2]
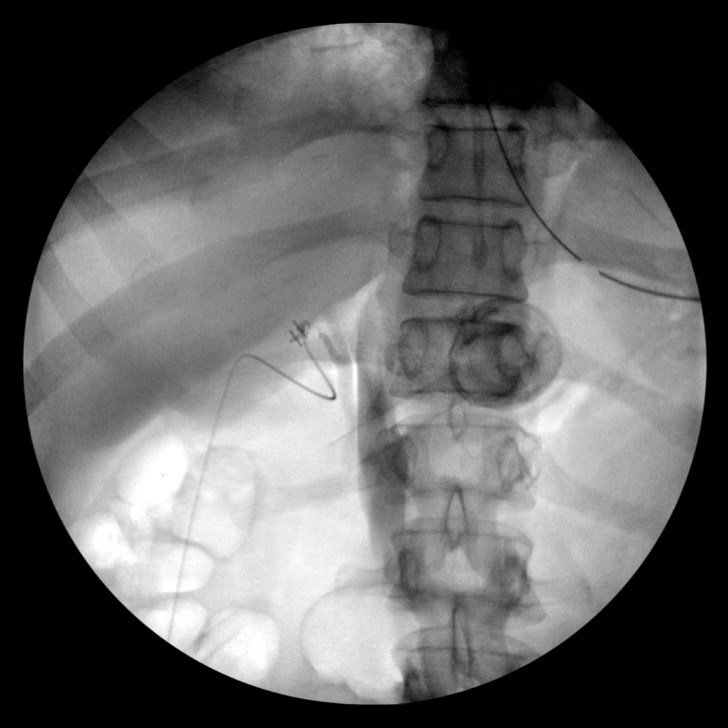
[im 1/2]
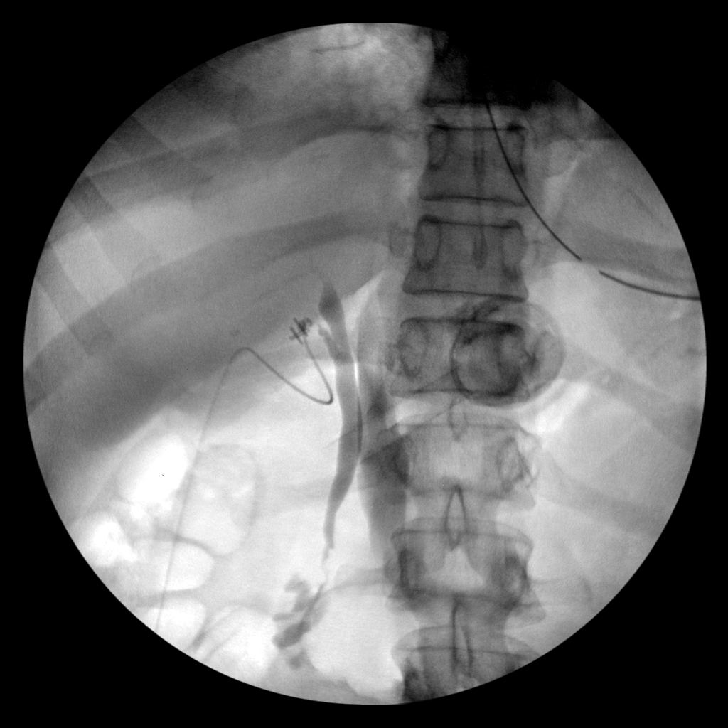
[im 1/2]
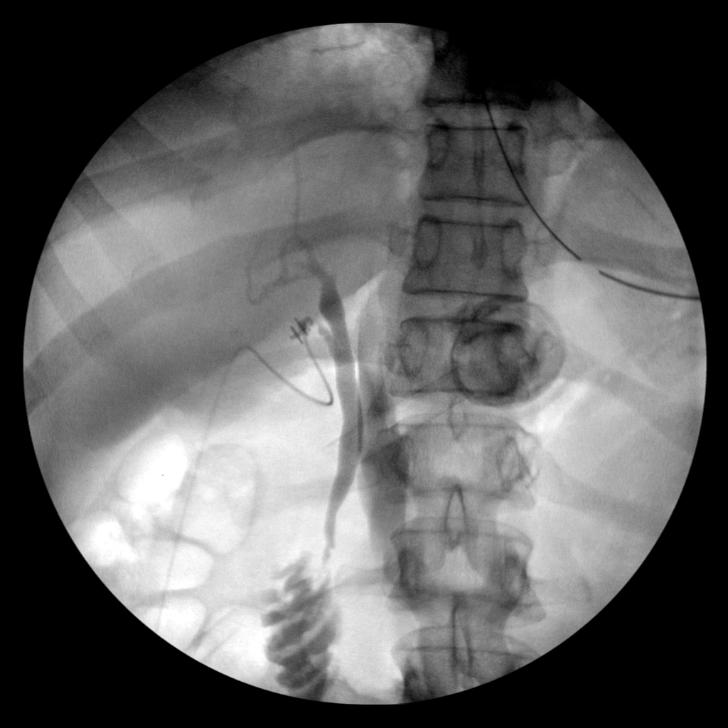
[im 1/2]
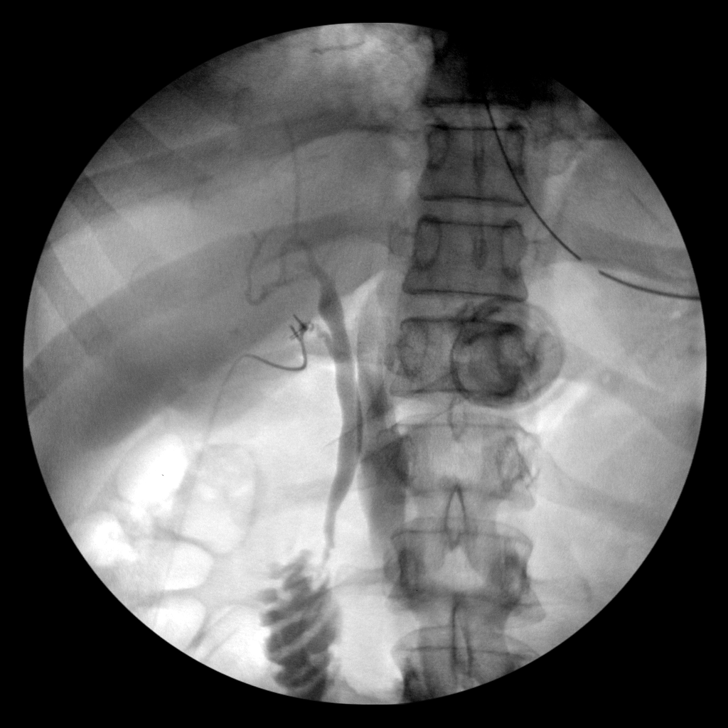
[im 2/2]
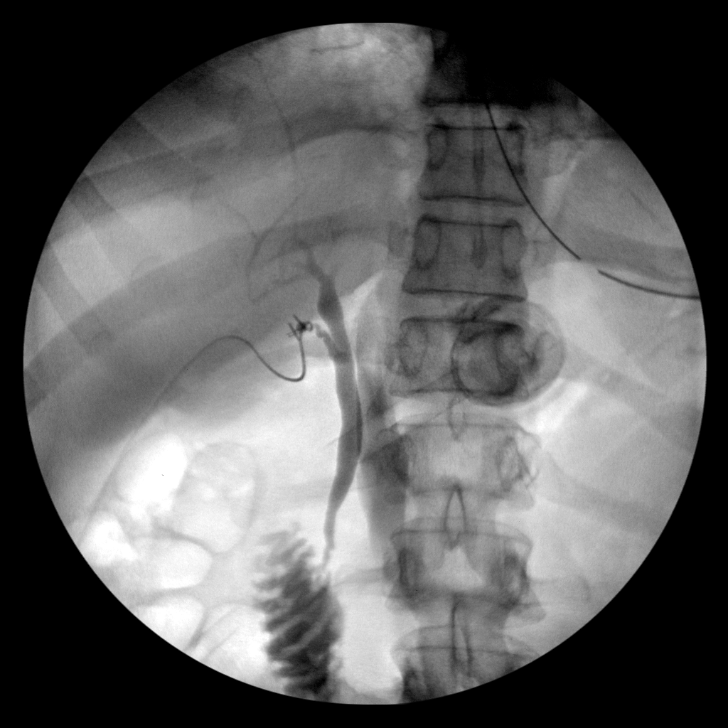
[im 2/2]
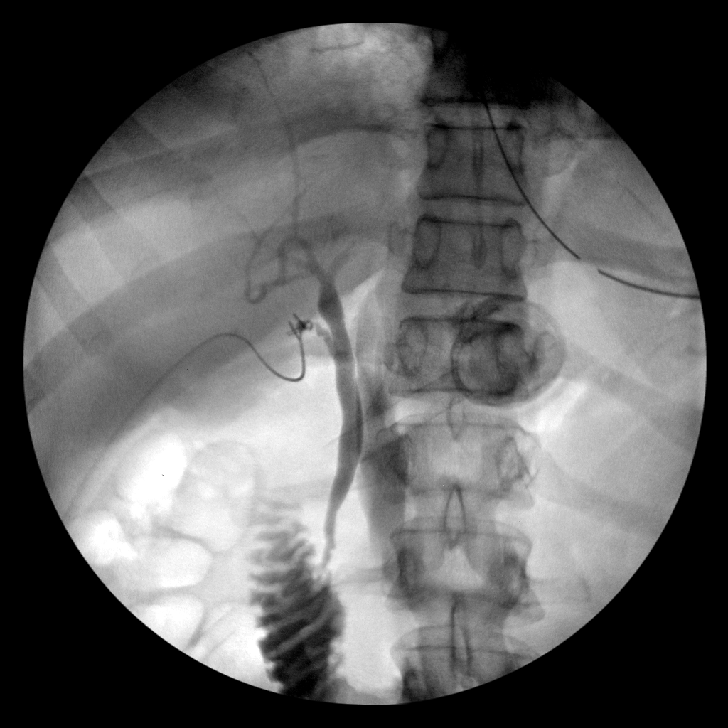
[im 2/2]
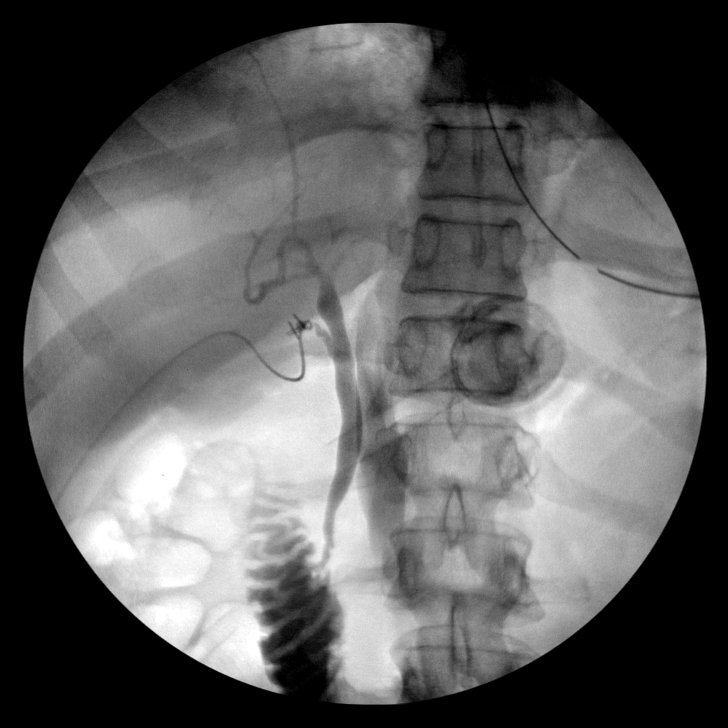
[im 2/2]
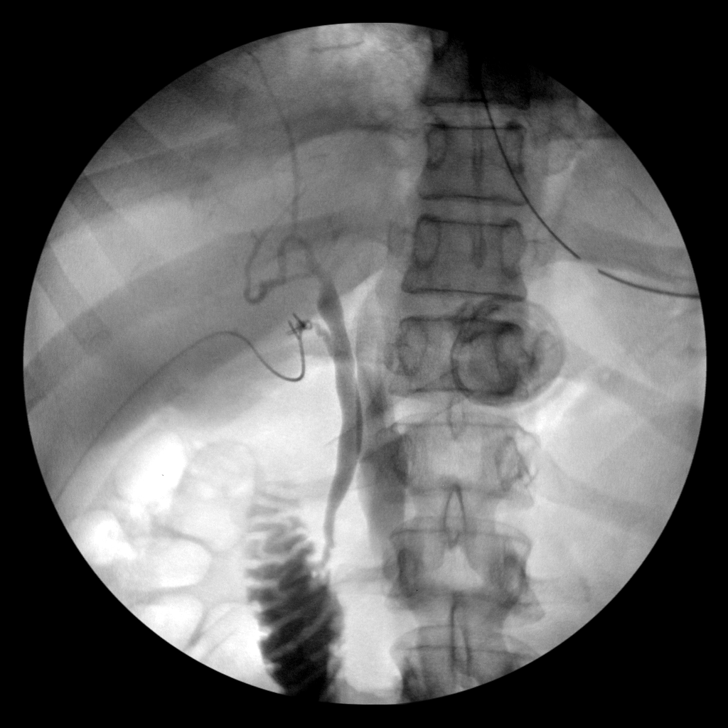

[8 of 8 positions shown; findings below may reference images not displayed]

FINDINGS: Intraoperative imaging with a C-arm demonstrates normal opacified
bile ducts without evidence of biliary dilatation or filling
defects. Contrast enters the duodenum normally. No contrast
extravasation identified.
IMPRESSION: Normal intraoperative cholangiogram.

## 2021-04-07 DIAGNOSIS — D224 Melanocytic nevi of scalp and neck: Secondary | ICD-10-CM | POA: Diagnosis not present

## 2021-04-26 ENCOUNTER — Emergency Department (HOSPITAL_BASED_OUTPATIENT_CLINIC_OR_DEPARTMENT_OTHER)
Admission: EM | Admit: 2021-04-26 | Discharge: 2021-04-26 | Disposition: A | Discharge status: Home / Self Care | Payer: BC Managed Care – PPO | Attending: Emergency Medicine | Admitting: Emergency Medicine

## 2021-04-26 ENCOUNTER — Other Ambulatory Visit: Payer: Self-pay

## 2021-04-26 ENCOUNTER — Ambulatory Visit: Payer: Self-pay | Admitting: Family Medicine

## 2021-04-26 ENCOUNTER — Emergency Department (HOSPITAL_BASED_OUTPATIENT_CLINIC_OR_DEPARTMENT_OTHER): Payer: BC Managed Care – PPO

## 2021-04-26 ENCOUNTER — Encounter (HOSPITAL_BASED_OUTPATIENT_CLINIC_OR_DEPARTMENT_OTHER): Payer: Self-pay

## 2021-04-26 DIAGNOSIS — M545 Low back pain, unspecified: Secondary | ICD-10-CM | POA: Diagnosis not present

## 2021-04-26 DIAGNOSIS — M5416 Radiculopathy, lumbar region: Secondary | ICD-10-CM | POA: Insufficient documentation

## 2021-04-26 DIAGNOSIS — M5126 Other intervertebral disc displacement, lumbar region: Secondary | ICD-10-CM | POA: Diagnosis not present

## 2021-04-26 DIAGNOSIS — M48061 Spinal stenosis, lumbar region without neurogenic claudication: Secondary | ICD-10-CM | POA: Diagnosis not present

## 2021-04-26 LAB — URINALYSIS, MICROSCOPIC (REFLEX)

## 2021-04-26 LAB — URINALYSIS, ROUTINE W REFLEX MICROSCOPIC
Bilirubin Urine: NEGATIVE
Glucose, UA: NEGATIVE mg/dL
Ketones, ur: NEGATIVE mg/dL
Nitrite: NEGATIVE
Protein, ur: NEGATIVE mg/dL
Specific Gravity, Urine: >=1.03 (ref 1.005–1.030)
pH: 5.5 (ref 5.0–8.0)

## 2021-04-26 MED ORDER — PREDNISONE 50 MG PO TABS
60.0000 mg | ORAL_TABLET | Freq: Once | ORAL | Status: AC
  Administered 2021-04-26: 60 mg via ORAL
  Filled 2021-04-26: qty 1

## 2021-04-26 MED ORDER — CYCLOBENZAPRINE HCL 5 MG PO TABS: 5.0000 mg | ORAL_TABLET | Freq: Three times a day (TID) | ORAL | 0 refills | Status: DC | PRN

## 2021-04-26 MED ORDER — PREDNISONE 20 MG PO TABS: ORAL_TABLET | ORAL | 0 refills | Status: DC

## 2021-04-26 MED ORDER — CYCLOBENZAPRINE HCL 5 MG PO TABS
5.0000 mg | ORAL_TABLET | Freq: Once | ORAL | Status: AC
  Administered 2021-04-26: 5 mg via ORAL
  Filled 2021-04-26: qty 1

## 2021-04-26 MED ORDER — HYDROCODONE-ACETAMINOPHEN 5-325 MG PO TABS: 1.0000 | ORAL_TABLET | Freq: Four times a day (QID) | ORAL | 0 refills | Status: DC | PRN

## 2021-04-26 NOTE — ED Triage Notes (Signed)
States lower back pain starting this weekend. Pain worsened Sunday, now stays in left lower back. Denies urinary symptoms. Pain worse with ambulation. Denies known injury. ?

## 2021-04-26 NOTE — Discharge Instructions (Addendum)
You have a pinched nerve in your back from spinal stenosis ? ?Take prednisone as prescribed ? ?Take Motrin 800 mg every 6 hours for pain ? ?Take Flexeril for muscle spasm ? ?Take Vicodin for severe pain ? ?See your doctor for follow-up.  You will likely need to see spine surgeon to discuss treatment options ? ?Return to ER if you have severe back pain, difficulty walking, weakness, numbness ?

## 2021-04-26 NOTE — ED Notes (Signed)
Patient transported to CT 

## 2021-04-26 NOTE — ED Provider Notes (Signed)
?[facility] EMERGENCY DEPARTMENT ?Provider Note ? ? ?CSN: [id number] ?Arrival date & time: 04/26/21  1300 ? ?  ? ?History ? ?Chief Complaint  ?Patient presents with  ? Back Pain  ? ? ?Chloe Conley is a 51 y.o. female here presenting with lower back pain.  Patient states that it started 3 days ago.  She states that she thought it was a pulled muscle on the left side.  She states that it is progressively worse now.  She states that every time she walks it hurts and it radiates down the left leg.  Denies any incontinence.  Denies any blood in her urine.  Denies any numbness or weakness ? ?The history is provided by the patient.  ? ?  ? ?Home Medications ?Prior to Admission medications   ?Medication Sig Start Date End Date Taking? Authorizing Provider  ?bimatoprost (LATISSE) 0.03 % ophthalmic solution Place 1 application into both eyes at bedtime. Place one drop on applicator and apply evenly along the skin of the upper eyelid at base of eyelashes once daily at bedtime; repeat procedure for second eye (use a clean applicator). 03/08/21   Nelwyn Salisbury, MD  ?citalopram (CELEXA) 40 MG tablet TAKE 1 TABLET BY MOUTH EVERY DAY 09/20/20   Nelwyn Salisbury, MD  ?fluticasone (FLONASE) 50 MCG/ACT nasal spray Place 1 spray into both nostrils 2 (two) times daily. 02/24/21 03/26/21  [place], Betty G, MD  ?Influenza Virus Vaccine Split SUSP Afluria 2013-2014 45 mcg (15 mcg x 3)/0.5 mL intramuscular suspension ? TO BE ADMINISTERED BY THE PHARMACIST    [provider]  ?Multiple Vitamin (MULTIVITAMIN WITH MINERALS) TABS tablet Take 1 tablet by mouth daily.    [provider]  ?polyethylene glycol (GOLYTELY) 236 g solution peg 3350-electrolytes 236 gram-22.74 gram-6.74 gram-5.86 gram solution ? TAKE AS DIRECTED    [provider]  ?SUMAtriptan (IMITREX) 100 MG tablet TAKE 1 TABLET BY MOUTH AS NEEDED FOR HEADACHE MAY REPEAT IN 2 HOURS IF NEEDED 01/10/21   Nelwyn Salisbury, MD  ?   ? ?Allergies    ?Patient has  no known allergies.   ? ?Review of Systems   ?Review of Systems  ?Musculoskeletal:  Positive for back pain.  ?All other systems reviewed and are negative. ? ?Physical Exam ?Updated Vital Signs ?BP (!) 150/78 (BP Location: Left Arm)   Pulse 81   Temp 98.1 ?F (36.7 ?C) (Oral)   Resp 16   Ht 5\' 6"  (1.676 m)   Wt 83.5 kg   SpO2 99%   BMI 29.70 kg/m?  ?Physical Exam ?Vitals and nursing note reviewed.  ?Constitutional:   ?   Comments: Uncomfortable  ?HENT:  ?   Head: Normocephalic.  ?   Nose: Nose normal.  ?   Mouth/Throat:  ?   Mouth: Mucous membranes are moist.  ?Eyes:  ?   Extraocular Movements: Extraocular movements intact.  ?   Pupils: Pupils are equal, round, and reactive to light.  ?Cardiovascular:  ?   Rate and Rhythm: Normal rate and regular rhythm.  ?   Pulses: Normal pulses.  ?   Heart sounds: Normal heart sounds.  ?Pulmonary:  ?   Effort: Pulmonary effort is normal.  ?   Breath sounds: Normal breath sounds.  ?Abdominal:  ?   General: Abdomen is flat.  ?   Palpations: Abdomen is soft.  ?Musculoskeletal:  ?   Cervical back: Normal range of motion and neck supple.  ?   Comments: Left  paralumbar tenderness.  Patient has positive straight leg raise on the left side.  Also has left SI joint tenderness  ?Skin: ?   Capillary Refill: Capillary refill takes less than 2 seconds.  ?Neurological:  ?   General: No focal deficit present.  ?   Mental Status: She is oriented to person, place, and time.  ?   Comments: No saddle anesthesia.  Patient does have positive straight leg raise on the left.  Patient able to ambulate by herself.  Patient has normal sensation and strength bilateral legs  ?Psychiatric:     ?   Mood and Affect: Mood normal.     ?   Behavior: Behavior normal.  ? ? ?ED Results / Procedures / Treatments   ?Labs ?(all labs ordered are listed, but only abnormal results are displayed) ?Labs Reviewed  ?URINALYSIS, ROUTINE W REFLEX MICROSCOPIC - Abnormal; Notable for the following components:  ?    Result  Value  ? Hgb urine dipstick TRACE (*)   ? Leukocytes,Ua TRACE (*)   ? All other components within normal limits  ?URINALYSIS, MICROSCOPIC (REFLEX) - Abnormal; Notable for the following components:  ? Bacteria, UA FEW (*)   ? All other components within normal limits  ? ? ?EKG ?None ? ?Radiology ?No results found. ? ?Procedures ?Procedures  ? ? ?Medications Ordered in ED ?Medications  ?predniSONE (DELTASONE) tablet 60 mg (has no administration in time range)  ?cyclobenzaprine (FLEXERIL) tablet 5 mg (has no administration in time range)  ? ? ?ED Course/ Medical Decision Making/ A&P ?  ?                        ?Medical Decision Making ?Chloe Conley is a 52 y.o. female here presenting with back pain.  I think likely sciatica.  Patient likely had a slipped disc causing her symptoms.  I have low suspicion for renal colic.  Plan to get CT lumbar spine and urinalysis.  Patient is already taking ibuprofen we will give Flexeril and prednisone ?  ?4:35 PM ?UA showed no obvious infection.  CT showed mild spinal stenosis at L4-5.  I think this is likely causing her sciatica symptoms.  Will discharge home with prednisone and pain medicine and muscle relaxants.  Will refer to spine surgery outpatient.  ? ?Problems Addressed: ?Lumbar radiculopathy: acute illness or injury ?Spinal stenosis of lumbar region, unspecified whether neurogenic claudication present: acute illness or injury ? ?Amount and/or Complexity of Data Reviewed ?Labs: ordered. Decision-making details documented in ED Course. ?Radiology: ordered and independent interpretation performed. Decision-making details documented in ED Course. ? ?Risk ?Prescription drug management. ? ? ?Final Clinical Impression(s) / ED Diagnoses ?Final diagnoses:  ?None  ? ? ?Rx / DC Orders ?ED Discharge Orders   ? ? None  ? ?  ? ? ?  ?Drenda Freeze, MD ?04/26/21 1637 ? ?

## 2021-05-12 DIAGNOSIS — M9902 Segmental and somatic dysfunction of thoracic region: Secondary | ICD-10-CM | POA: Diagnosis not present

## 2021-05-12 DIAGNOSIS — M9903 Segmental and somatic dysfunction of lumbar region: Secondary | ICD-10-CM | POA: Diagnosis not present

## 2021-05-12 DIAGNOSIS — M9904 Segmental and somatic dysfunction of sacral region: Secondary | ICD-10-CM | POA: Diagnosis not present

## 2021-05-12 DIAGNOSIS — M5417 Radiculopathy, lumbosacral region: Secondary | ICD-10-CM | POA: Diagnosis not present

## 2021-05-19 DIAGNOSIS — M9903 Segmental and somatic dysfunction of lumbar region: Secondary | ICD-10-CM | POA: Diagnosis not present

## 2021-05-19 DIAGNOSIS — M5417 Radiculopathy, lumbosacral region: Secondary | ICD-10-CM | POA: Diagnosis not present

## 2021-05-19 DIAGNOSIS — M9904 Segmental and somatic dysfunction of sacral region: Secondary | ICD-10-CM | POA: Diagnosis not present

## 2021-05-19 DIAGNOSIS — M9902 Segmental and somatic dysfunction of thoracic region: Secondary | ICD-10-CM | POA: Diagnosis not present

## 2021-05-26 DIAGNOSIS — M5417 Radiculopathy, lumbosacral region: Secondary | ICD-10-CM | POA: Diagnosis not present

## 2021-05-26 DIAGNOSIS — M7602 Gluteal tendinitis, left hip: Secondary | ICD-10-CM | POA: Diagnosis not present

## 2021-05-26 DIAGNOSIS — M9902 Segmental and somatic dysfunction of thoracic region: Secondary | ICD-10-CM | POA: Diagnosis not present

## 2021-05-26 DIAGNOSIS — M9904 Segmental and somatic dysfunction of sacral region: Secondary | ICD-10-CM | POA: Diagnosis not present

## 2021-05-26 DIAGNOSIS — M9903 Segmental and somatic dysfunction of lumbar region: Secondary | ICD-10-CM | POA: Diagnosis not present

## 2021-06-09 DIAGNOSIS — M9902 Segmental and somatic dysfunction of thoracic region: Secondary | ICD-10-CM | POA: Diagnosis not present

## 2021-06-09 DIAGNOSIS — M9903 Segmental and somatic dysfunction of lumbar region: Secondary | ICD-10-CM | POA: Diagnosis not present

## 2021-06-09 DIAGNOSIS — M9904 Segmental and somatic dysfunction of sacral region: Secondary | ICD-10-CM | POA: Diagnosis not present

## 2021-06-09 DIAGNOSIS — M5417 Radiculopathy, lumbosacral region: Secondary | ICD-10-CM | POA: Diagnosis not present

## 2021-06-13 IMAGING — CT CT ABD-PELV W/ CM
2 of 5 series · 17 of 46 positions shown, 19 images · IV contrast (Omnipaque)
Comparison: None.

CLINICAL DATA: Left abdominal pain with tenderness for 2 days.

EXAM:
CT ABDOMEN AND PELVIS WITH CONTRAST
TECHNIQUE: Multidetector CT imaging of the abdomen and pelvis was performed
using the standard protocol following bolus administration of
intravenous contrast.
CONTRAST:  100mL OMNIPAQUE IOHEXOL 300 MG/ML  SOLN

[Series 3: axial st · axial · 0.84mm/px · z∈[-494,-44]mm · 14 of 102 slices shown, 16 images]
[im 6/102  soft-tissue]
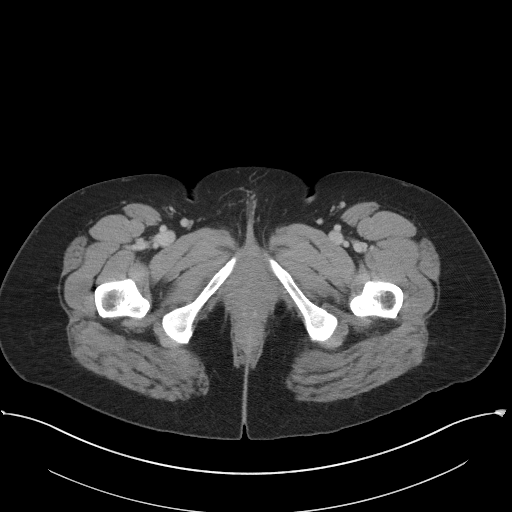
[im 6/102  bone]
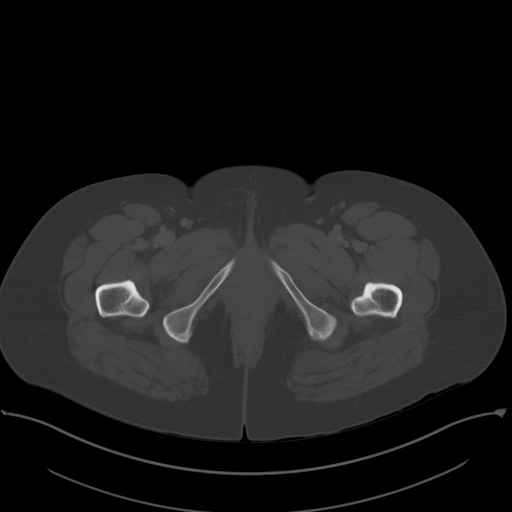
[im 12/102  soft-tissue]
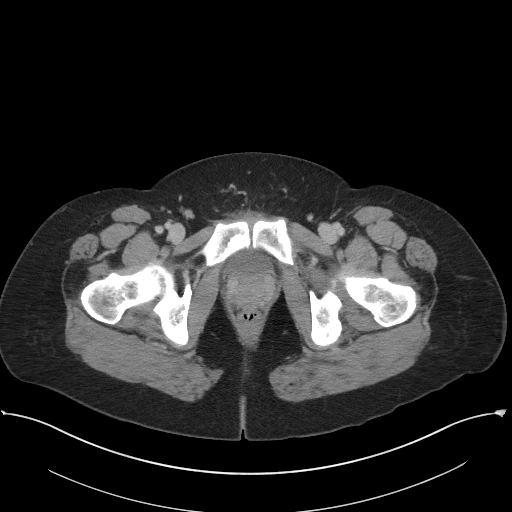
[im 23/102  soft-tissue]
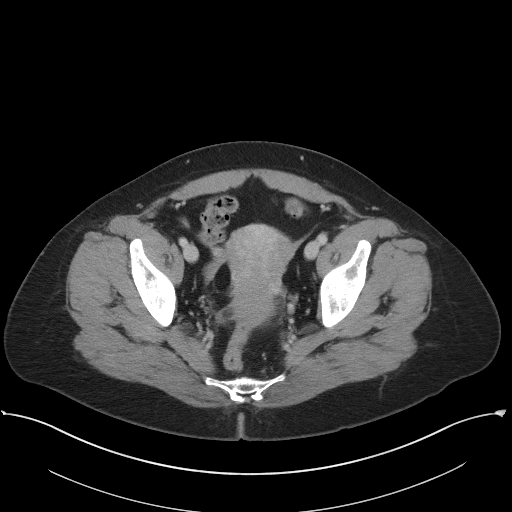
[im 29/102  soft-tissue]
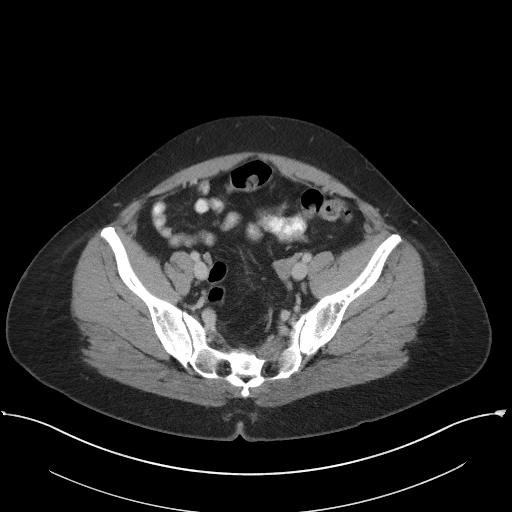
[im 34/102  soft-tissue]
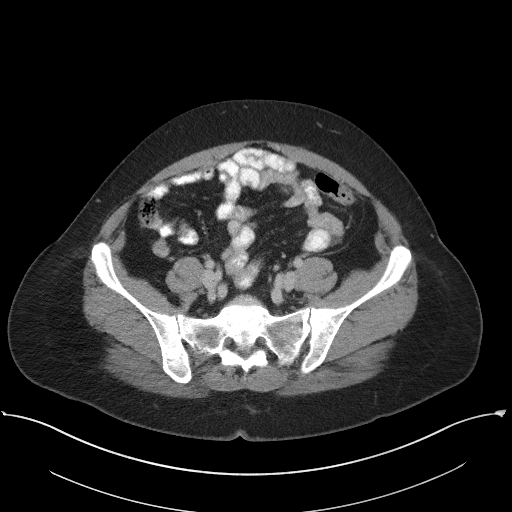
[im 40/102  soft-tissue]
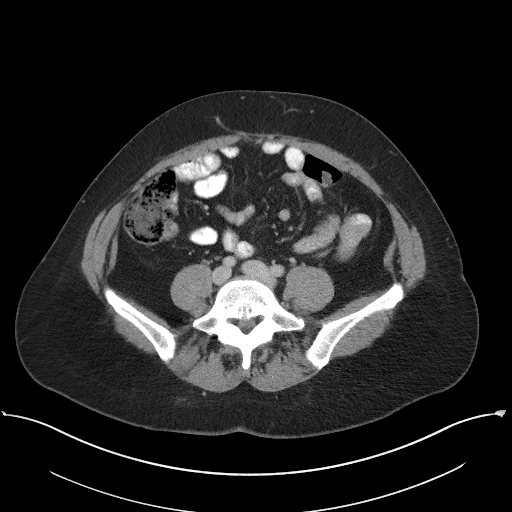
[im 45/102  soft-tissue]
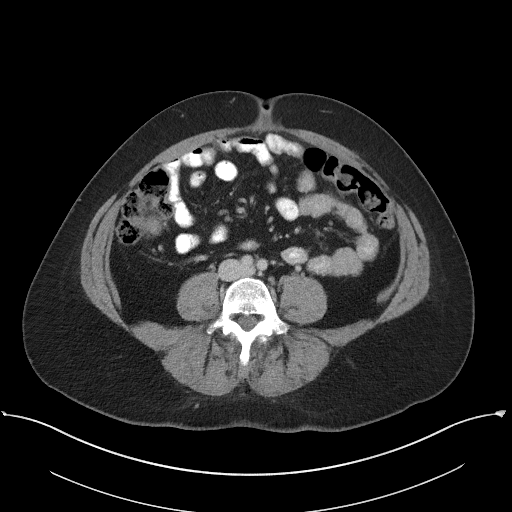
[im 57/102  soft-tissue]
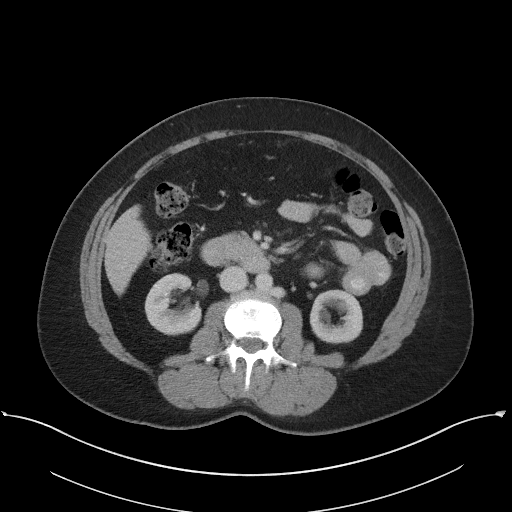
[im 62/102  soft-tissue]
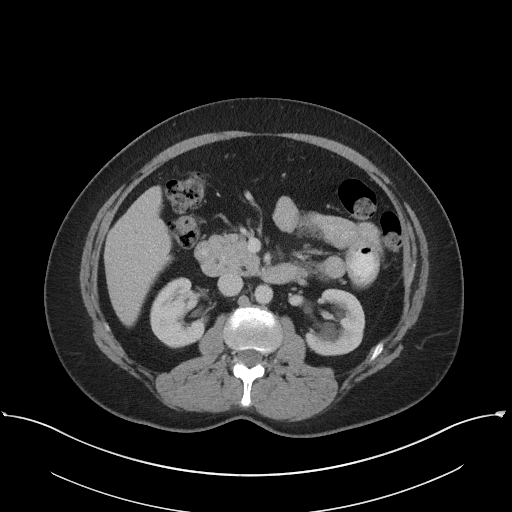
[im 62/102  bone]
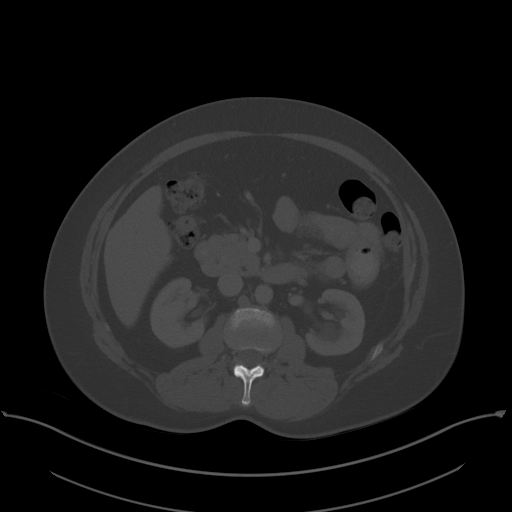
[im 68/102  soft-tissue]
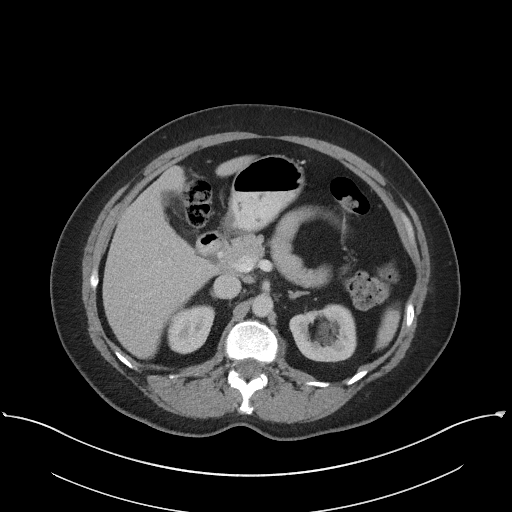
[im 73/102  soft-tissue]
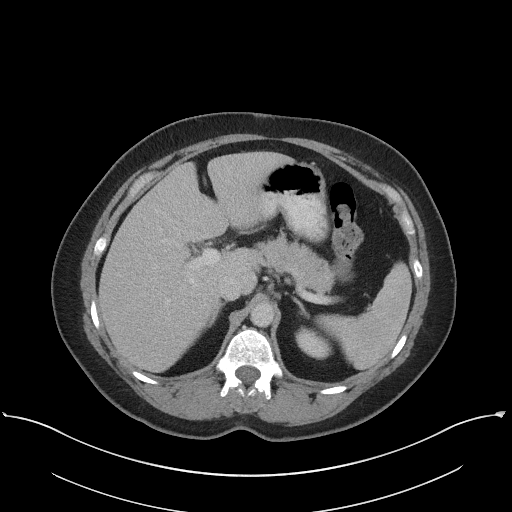
[im 79/102  soft-tissue]
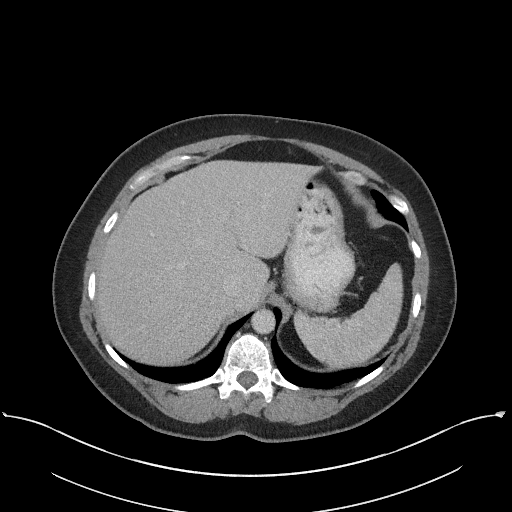
[im 90/102  soft-tissue]
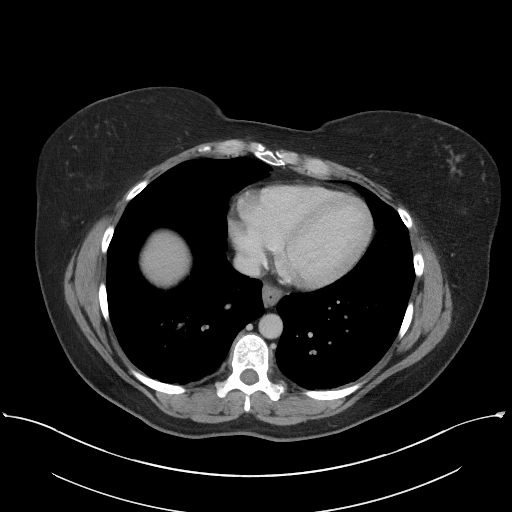
[im 96/102  soft-tissue]
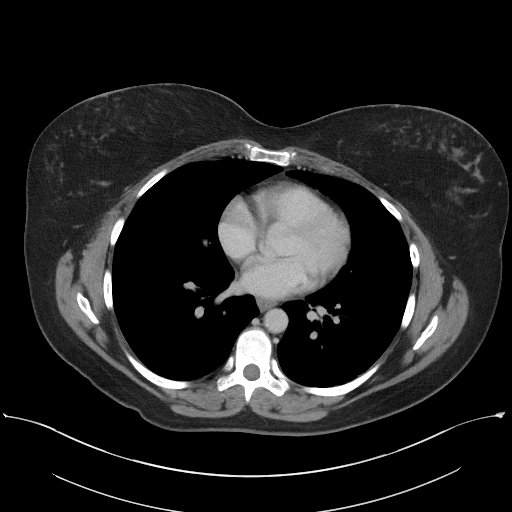

[Series 6: coronal st · coronal · 0.83mm/px · 3 of 84 slices shown]
[im 28/84  soft-tissue]
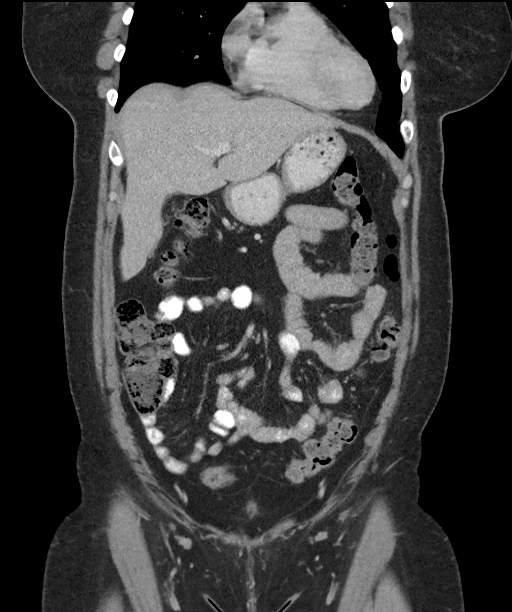
[im 37/84  soft-tissue]
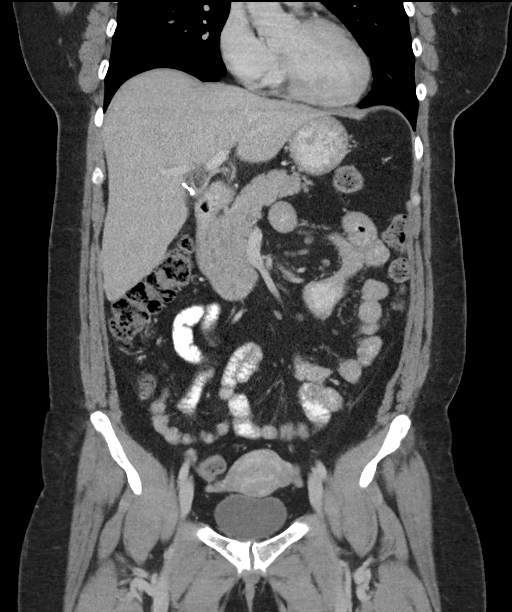
[im 47/84  soft-tissue]
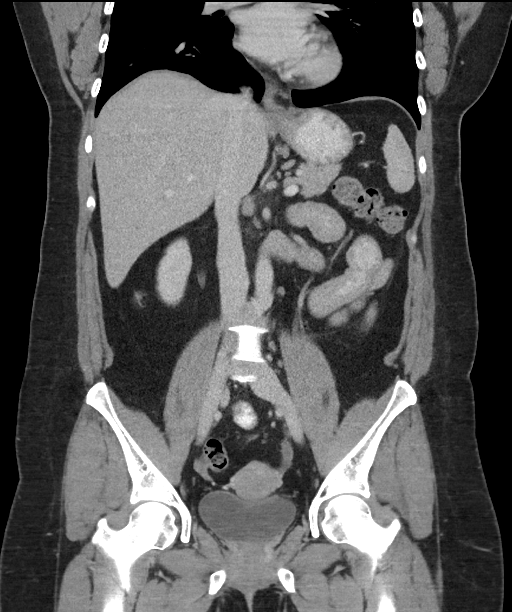

[17 of 46 positions shown; findings below may reference images not displayed]

FINDINGS: Lower chest:  No contributory findings.

Hepatobiliary: No focal liver abnormality.Cholecystectomy. Normal
bile duct diameter.

Pancreas: Unremarkable.

Spleen: Unremarkable.

Adrenals/Urinary Tract: Negative adrenals. No hydronephrosis or
stone. Renal sinus cysts on the left. Unremarkable bladder.

Stomach/Bowel: Focal area of fat stranding along the anti mesenteric
border of the distal transverse colon without underlying
diverticulum. No underlying colonic wall thickening. No bowel
obstruction. No appendicitis.

Vascular/Lymphatic: No acute vascular abnormality. No mass or
adenopathy.

Reproductive:No pathologic findings.

Other: No ascites or pneumoperitoneum.

Musculoskeletal: No acute abnormalities.
IMPRESSION: Findings of epiploic appendagitis along the distal transverse colon.

## 2021-06-16 DIAGNOSIS — M9904 Segmental and somatic dysfunction of sacral region: Secondary | ICD-10-CM | POA: Diagnosis not present

## 2021-06-16 DIAGNOSIS — M5417 Radiculopathy, lumbosacral region: Secondary | ICD-10-CM | POA: Diagnosis not present

## 2021-06-16 DIAGNOSIS — M9903 Segmental and somatic dysfunction of lumbar region: Secondary | ICD-10-CM | POA: Diagnosis not present

## 2021-06-16 DIAGNOSIS — M9902 Segmental and somatic dysfunction of thoracic region: Secondary | ICD-10-CM | POA: Diagnosis not present

## 2021-06-16 DIAGNOSIS — M7602 Gluteal tendinitis, left hip: Secondary | ICD-10-CM | POA: Diagnosis not present

## 2021-07-03 ENCOUNTER — Other Ambulatory Visit: Payer: Self-pay | Admitting: Obstetrics and Gynecology

## 2021-07-03 DIAGNOSIS — Z1231 Encounter for screening mammogram for malignant neoplasm of breast: Secondary | ICD-10-CM

## 2021-07-07 ENCOUNTER — Ambulatory Visit
Admission: RE | Admit: 2021-07-07 | Discharge: 2021-07-07 | Disposition: A | Discharge status: Home / Self Care | Payer: BC Managed Care – PPO | Source: Ambulatory Visit | Attending: Obstetrics and Gynecology | Admitting: Obstetrics and Gynecology

## 2021-07-07 DIAGNOSIS — Z1231 Encounter for screening mammogram for malignant neoplasm of breast: Secondary | ICD-10-CM

## 2021-08-04 DIAGNOSIS — Z6831 Body mass index (BMI) 31.0-31.9, adult: Secondary | ICD-10-CM | POA: Diagnosis not present

## 2021-08-04 DIAGNOSIS — Z01419 Encounter for gynecological examination (general) (routine) without abnormal findings: Secondary | ICD-10-CM | POA: Diagnosis not present

## 2021-09-01 DIAGNOSIS — Z6831 Body mass index (BMI) 31.0-31.9, adult: Secondary | ICD-10-CM | POA: Diagnosis not present

## 2021-09-01 DIAGNOSIS — K5900 Constipation, unspecified: Secondary | ICD-10-CM | POA: Diagnosis not present

## 2021-10-08 ENCOUNTER — Other Ambulatory Visit: Payer: Self-pay | Admitting: Family Medicine

## 2021-11-29 ENCOUNTER — Ambulatory Visit: Payer: BC Managed Care – PPO | Admitting: Family Medicine

## 2021-11-29 ENCOUNTER — Encounter: Payer: Self-pay | Admitting: Family Medicine

## 2021-11-29 VITALS — BP 130/80 | HR 81 | Temp 98.2°F | Wt 199.0 lb

## 2021-11-29 DIAGNOSIS — F411 Generalized anxiety disorder: Secondary | ICD-10-CM

## 2021-11-29 DIAGNOSIS — F900 Attention-deficit hyperactivity disorder, predominantly inattentive type: Secondary | ICD-10-CM | POA: Diagnosis not present

## 2021-11-29 MED ORDER — AMPHETAMINE-DEXTROAMPHET ER 10 MG PO CP24: 10.0000 mg | ORAL_CAPSULE | Freq: Every day | ORAL | 0 refills | Status: DC

## 2021-11-29 MED ORDER — CITALOPRAM HYDROBROMIDE 40 MG PO TABS: 40.0000 mg | ORAL_TABLET | Freq: Every day | ORAL | 3 refills | Status: DC

## 2021-11-29 NOTE — Progress Notes (Signed)
   Subjective:    Patient ID: Chloe Conley, female    DOB: [birth year], 51 y.o.   MRN: [id number]  HPI Here to follow up on anxiety and also to discuss the possibility of her taking Adderall. 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 Now she has so many things to keep up with at the same time, she finds she cannot focus on tasks and she does not finish tasks when she should. She  often feels overwhelmed by this. Recently she tried taking some Adderall she got from a coworker, and this worked very well for her. She was able to focus and get things done eeficiently. She reports no side effects. Otherwise her anxiety is stable, and she wants to stay on Citalopram.    Review of Systems  Constitutional: Negative.   Respiratory: Negative.    Cardiovascular: Negative.   Neurological: Negative.   Psychiatric/Behavioral:  Positive for decreased concentration. Negative for agitation, confusion, dysphoric mood and sleep disturbance. The patient is not nervous/anxious.        Objective:   Physical Exam Constitutional:      Appearance: Normal appearance.  Cardiovascular:     Rate and Rhythm: Normal rate and regular rhythm.     Pulses: Normal pulses.     Heart sounds: Normal heart sounds.  Pulmonary:     Effort: Pulmonary effort is normal.     Breath sounds: Normal breath sounds.  Neurological:     General: No focal deficit present.     Mental Status: She is alert and oriented to person, place, and time.  Psychiatric:        Mood and Affect: Mood normal.        Behavior: Behavior normal.        Thought Content: Thought content normal.           Assessment & Plan:  Her anxiety is well controlled, so we refilled the Citalopram. She has newly diagnosed ADHD, and she will try Adderall XR 10 mg each morning for a few weeks. She will schedule a well exam with labs in a few weeks, and we will follow up on the ADHD at that time.   Gershon Crane, MD

## 2021-12-29 ENCOUNTER — Encounter: Payer: Self-pay | Admitting: Family Medicine

## 2022-01-01 MED ORDER — AMPHETAMINE-DEXTROAMPHET ER 15 MG PO CP24: 15.0000 mg | ORAL_CAPSULE | ORAL | 0 refills | Status: DC

## 2022-01-01 NOTE — Telephone Encounter (Signed)
I sent in Adderall XR 15 mg for her to try

## 2022-02-16 ENCOUNTER — Telehealth: Payer: Self-pay | Admitting: Family Medicine

## 2022-02-16 MED ORDER — AMPHETAMINE-DEXTROAMPHET ER 15 MG PO CP24: 15.0000 mg | ORAL_CAPSULE | ORAL | 0 refills | Status: DC

## 2022-02-16 NOTE — Telephone Encounter (Signed)
Pharmacy updated.

## 2022-02-16 NOTE — Addendum Note (Signed)
Addended by: Alysia Penna A on: 02/16/2022 04:17 PM   Modules accepted: Orders

## 2022-02-16 NOTE — Telephone Encounter (Signed)
Pt requesting refill amphetamine-dextroamphetamine (ADDERALL XR) 15 MG 24 hr capsule and asking if dosage can be increased, does not feel any improvement   [id number] - [place], [place] - [address] Phone: [phone]  Fax: [phone]

## 2022-02-16 NOTE — Telephone Encounter (Signed)
Done

## 2022-03-28 ENCOUNTER — Encounter (HOSPITAL_BASED_OUTPATIENT_CLINIC_OR_DEPARTMENT_OTHER): Payer: Self-pay | Admitting: Emergency Medicine

## 2022-03-28 ENCOUNTER — Emergency Department (HOSPITAL_BASED_OUTPATIENT_CLINIC_OR_DEPARTMENT_OTHER): Payer: BC Managed Care – PPO | Admitting: Radiology

## 2022-03-28 ENCOUNTER — Emergency Department (HOSPITAL_BASED_OUTPATIENT_CLINIC_OR_DEPARTMENT_OTHER)
Admission: EM | Admit: 2022-03-28 | Discharge: 2022-03-28 | Disposition: A | Discharge status: Home / Self Care | Payer: BC Managed Care – PPO | Attending: Emergency Medicine | Admitting: Emergency Medicine

## 2022-03-28 ENCOUNTER — Telehealth: Payer: Self-pay

## 2022-03-28 ENCOUNTER — Other Ambulatory Visit: Payer: Self-pay

## 2022-03-28 DIAGNOSIS — R079 Chest pain, unspecified: Secondary | ICD-10-CM | POA: Diagnosis not present

## 2022-03-28 DIAGNOSIS — R0789 Other chest pain: Secondary | ICD-10-CM | POA: Diagnosis not present

## 2022-03-28 LAB — BASIC METABOLIC PANEL
Anion gap: 7 (ref 5–15)
BUN: 27 mg/dL — ABNORMAL HIGH (ref 6–20)
CO2: 28 mmol/L (ref 22–32)
Calcium: 10.3 mg/dL (ref 8.9–10.3)
Chloride: 104 mmol/L (ref 98–111)
Creatinine, Ser: 0.95 mg/dL (ref 0.44–1.00)
GFR, Estimated: >60 mL/min (ref >60)
Glucose, Bld: 97 mg/dL (ref 70–99)
Potassium: 4.4 mmol/L (ref 3.5–5.1)
Sodium: 139 mmol/L (ref 135–145)

## 2022-03-28 LAB — CBC
HCT: 42 % (ref 36.0–46.0)
Hemoglobin: 14.2 g/dL (ref 12.0–15.0)
MCH: 30.3 pg (ref 26.0–34.0)
MCHC: 33.8 g/dL (ref 30.0–36.0)
MCV: 89.7 fL (ref 80.0–100.0)
Platelets: 256 10*3/uL (ref 150–400)
RBC: 4.68 MIL/uL (ref 3.87–5.11)
RDW: 12.3 % (ref 11.5–15.5)
WBC: 6.7 10*3/uL (ref 4.0–10.5)
nRBC: 0 % (ref 0.0–0.2)

## 2022-03-28 LAB — TROPONIN I (HIGH SENSITIVITY): Troponin I (High Sensitivity): <2 ng/L (ref <18)

## 2022-03-28 NOTE — ED Provider Notes (Signed)
[facility] Provider Note   CSN: [id number] Arrival date & time: 03/28/22  1224     History {Add pertinent medical, surgical, social history, OB history to HPI:1} No chief complaint on file.   Chloe Conley is a 52 y.o. female.  HPI Patient presents with chest pressure.  States she felt it last night began this morning.  Anterior chest.  Not exertional.  States improving now.  No shortness of breath.  States developed it last night and then had more episode this morning.  Now improving.  Last about half hour this morning.  History of PVCs.  No known history of coronary artery disease.   Past Medical History:  Diagnosis Date   Allergy    Anxiety    Frequent PVCs    sees Dr. Virl Axe    Headache(784.0)    IBS (irritable bowel syndrome)    Routine gynecological examination    sees Dr. 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 Authorizing Provider  amphetamine-dextroamphetamine (ADDERALL XR) 15 MG 24 hr capsule Take 1 capsule by mouth every morning. 04/17/22 05/17/22  Laurey Morale, MD  bimatoprost (LATISSE) 0.03 % ophthalmic solution Place 1 application into both eyes at bedtime. Place one drop on applicator and apply evenly along the skin of the upper eyelid at base of eyelashes once daily at bedtime; repeat procedure for second eye (use a clean applicator). 03/08/21   Laurey Morale, MD  citalopram (CELEXA) 40 MG tablet Take 1 tablet (40 mg total) by mouth daily. 11/29/21   Laurey Morale, MD  Influenza Virus Vaccine Split SUSP Afluria 2013-2014 45 mcg (15 mcg x 3)/0.5 mL intramuscular suspension  TO BE ADMINISTERED BY THE PHARMACIST    [provider]  polyethylene glycol (GOLYTELY) 236 g solution peg 3350-electrolytes 236 gram-22.74 gram-6.74 gram-5.86 gram solution  TAKE AS DIRECTED    [provider]       Allergies    Patient has no known allergies.    Review of Systems   Review of Systems  Physical Exam Updated Vital Signs BP 137/73 (BP Location: Right Arm)   Pulse 72   Temp 98 F (36.7 C) (Oral)   Resp 14   Ht '5\' 6"'$  (1.676 m)   Wt 87.5 kg   SpO2 98%   BMI 31.15 kg/m  Physical Exam Vitals and nursing note reviewed.  Eyes:     Pupils: Pupils are equal, round, and reactive to light.  Cardiovascular:     Rate and Rhythm: Regular rhythm.  Pulmonary:     Breath sounds: No wheezing.  Abdominal:     Tenderness: There is no abdominal tenderness.  Musculoskeletal:        General: No tenderness.     Cervical back: Neck supple.     Right lower leg: No edema.     Left lower leg: No edema.  Skin:    Capillary Refill: Capillary refill takes less than 2 seconds.  Neurological:     Mental Status: She is alert and oriented to person, place, and time.     ED Results / Procedures / Treatments   Labs (all labs ordered are listed, but only abnormal results are displayed) Labs Reviewed  BASIC METABOLIC PANEL  CBC  PREGNANCY, URINE  TROPONIN I (HIGH SENSITIVITY)    EKG EKG Interpretation  Date/Time:  Wednesday  March 28 2022 12:31:39 EDT Ventricular Rate:  68 PR Interval:  144 QRS Duration: 82 QT Interval:  400 QTC Calculation: 425 R Axis:   60 Text Interpretation: Normal sinus rhythm Normal ECG When compared with ECG of 11-Dec-2018 03:38, No significant change was found Confirmed by Davonna Belling [phone]) on 03/28/2022 12:59:36 PM  Radiology No results found.  Procedures Procedures  {Document cardiac monitor, telemetry assessment procedure when appropriate:1}  Medications Ordered in ED Medications - No data to display  ED Course/ Medical Decision Making/ A&P   {   Click here for ABCD2, HEART and other calculatorsREFRESH Note before signing :1}                          Medical Decision Making Amount and/or Complexity of Data Reviewed Labs:  ordered. Radiology: ordered.   Patient with chest pain.  Began last night.  Improved today.  EKG reassuring.  Differential diagnosis includes many causes of chest pain such as coronary disease, nonspecific chest pain, reflux.  No arrhythmias seen.  EKG nonischemic.  Will get basic blood work and chest x-ray.  Reviewed previous note from electrophysiology but it was just a telephone call.  {Document critical care time when appropriate:1} {Document review of labs and clinical decision tools ie heart score, Chads2Vasc2 etc:1}  {Document your independent review of radiology images, and any outside records:1} {Document your discussion with family members, caretakers, and with consultants:1} {Document social determinants of health affecting pt's care:1} {Document your decision making why or why not admission, treatments were needed:1} Final Clinical Impression(s) / ED Diagnoses Final diagnoses:  None    Rx / DC Orders ED Discharge Orders     None

## 2022-03-28 NOTE — ED Notes (Signed)
Discharge paperwork given and verbally understood. 

## 2022-03-28 NOTE — ED Triage Notes (Signed)
Pt via pov from home with chest pressure and tightness upon awakening this morning. Pt states major pressure resolved within 30 minutes, but feels like her tightness continues. Pt also reports tingling in her right hand. Pt denies speech difficulty or balance issues. No noted strength or sensation deficits. Pt alert & oriented, nad noted.

## 2022-03-30 NOTE — Telephone Encounter (Signed)
Message sent to PCP as  FYI

## 2022-04-27 DIAGNOSIS — M25562 Pain in left knee: Secondary | ICD-10-CM | POA: Diagnosis not present

## 2022-05-01 NOTE — Progress Notes (Deleted)
Cardiology Office Note:    Date:  05/01/2022   ID:  Chloe Conley, DOB Sep 24, 1970, MRN [id number]  PCP:  Nelwyn Salisbury, MD   [facility] Providers Cardiologist:  None { Click to update primary MD,subspecialty MD or APP then REFRESH:1}    Referring MD: Benjiman Core, MD   CC: *** Consulted for the evaluation of Chest pain at the behest of Dr. Rubin Payor   History of Present Illness:    Chloe Conley is a 52 y.o. female with a hx of Frequent RVOT PVCs, Aortic atherosclerosis seen in the past by Drs. Delton See and Graciela Husbands.  Patient notes that she is feeling ***.   Was last feeling well ***. Able to ***  Has had no chest pain, chest pressure, chest tightness, chest stinging ***.  Discomfort occurs with ***, worsens with ***, and improves with ***.    Patient exertion notable for *** with *** and feels no symptoms.    No shortness of breath, DOE ***.  No PND or orthopnea***.  No weight gain***, leg swelling ***, or abdominal swelling***.  No syncope or near syncope ***. Notes *** no palpitations or funny heart beats.     Patient reports prior cardiac testing including ***  No history of ***pre-eclampsia, gestation HTN or gestational DM.  No Fen-Phen or drug use***.  Ambulatory BP ***.   Past Medical History:  Diagnosis Date   Allergy    Anxiety    Chest pain    Frequent PVCs    sees Dr. Sherryl Manges    Headache(784.0)    IBS (irritable bowel syndrome)    Routine gynecological examination    sees Dr. Waynard Reeds   RVOT ventricular tachycardia 2015    Past Surgical History:  Procedure Laterality Date   BREAST ENHANCEMENT SURGERY     CESAREAN SECTION  04/30/2005   Dr Edward Jolly   CHOLECYSTECTOMY N/A 12/11/2018   Procedure: LAPAROSCOPIC CHOLECYSTECTOMY WITH INTRAOPERATIVE CHOLANGIOGRAM;  Surgeon: [name], MD;  Location: [facility];  Service: General;  Laterality: N/A;   COLONOSCOPY  01/01/2013   per Dr. Loreta Ave, clear, repeat in 10 yrs    DIAGNOSTIC LAPAROSCOPY      For IVF/Fertility    DILATION AND EVACUATION  05/14/2003   REDUCTION MAMMAPLASTY Bilateral    bilat lift   RHINOPLASTY     SEPTOPLASTY      Current Medications: No outpatient medications have been marked as taking for the 05/02/22 encounter (Appointment) with Christell Constant, MD.     Allergies:   Patient has no known allergies.   Social History   Socioeconomic History   Marital status: Married    Spouse name: Not on file   Number of children: Not on file   Years of education: Not on file   Highest education level: Not on file  Occupational History   Not on file  Tobacco Use   Smoking status: Never   Smokeless tobacco: Never  Vaping Use   Vaping Use: Never used  Substance and Sexual Activity   Alcohol use: No    Comment: rarely   Drug use: No   Sexual activity: Not on file  Other Topics Concern   Not on file  Social History Narrative   Not on file   Social Determinants of Health   Financial Resource Strain: Not on file  Food Insecurity: Not on file  Transportation Needs: Not on file  Physical Activity: Not on file  Stress: Not on file  Social  Connections: Not on file     Family History: The patient's ***family history includes Diabetes in an other family member; Migraines in an other family member.  ROS:   Please see the history of present illness.    *** All other systems reviewed and are negative.  EKGs/Labs/Other Studies Reviewed:    The following studies were reviewed today: ***  EKG:  EKG is *** ordered today.  The ekg ordered today demonstrates ***       Recent Labs: 03/28/2022: BUN 27; Creatinine, Ser 0.95; Hemoglobin 14.2; Platelets 256; Potassium 4.4; Sodium 139  Recent Lipid Panel    Component Value Date/Time   CHOL 206 (H) 06/22/2016 1109   TRIG 100.0 06/22/2016 1109   HDL 54.90 06/22/2016 1109   CHOLHDL 4 06/22/2016 1109   VLDL 20.0 06/22/2016 1109   LDLCALC 131 (H) 06/22/2016 1109   LDLDIRECT 120.7 01/29/2011 0933      Risk Assessment/Calculations:   {Does this patient have ATRIAL FIBRILLATION?[phone]}  No BP recorded.  {Refresh Note OR Click here to enter BP  :1}***         Physical Exam:    VS:  There were no vitals taken for this visit.    Wt Readings from Last 3 Encounters:  03/28/22 193 lb (87.5 kg)  11/29/21 199 lb (90.3 kg)  04/26/21 184 lb (83.5 kg)     GEN: *** Well nourished, well developed in no acute distress HEENT: Normal NECK: No JVD; No carotid bruits LYMPHATICS: No lymphadenopathy CARDIAC: ***RRR, no murmurs, rubs, gallops RESPIRATORY:  Clear to auscultation without rales, wheezing or rhonchi  ABDOMEN: Soft, non-tender, non-distended MUSCULOSKELETAL:  No edema; No deformity  SKIN: Warm and dry NEUROLOGIC:  Alert and oriented x 3 PSYCHIATRIC:  Normal affect   ASSESSMENT:    No diagnosis found. PLAN:    Precordial Pain - The patient presents with cardiac/possibly cardiac/non-cardiac pain *** - *** Criteria to defer EKG  stress include without evidence of accessory pathway, ventricular pacing, digoxin use, LBBB, or baseline ST changes.  The ASCVD Risk score (Arnett DK, et al., 2019) failed to calculate for the following reasons:   Cannot find a previous HDL lab   Cannot find a previous total cholesterol lab  - Additional Blood Work:  Lipids ***  - *** ASA 81 mg QD, current statin, and beta blocker therapies ***  - Sublingual nitroglycerin as need for chest pain. *** - Would recommend an echocardiogram to assess LVEF and exclude WMA.   - Would recommend CCTA with possible FFR as needed to exclude obstructive CAD and to assess for non-obstructive CAD requiring secondary prevention - BMI in *** so high tube current may be necessary, *** no hx of AF ivabradine - resting heart rate is ***50-60 bpm, given BP room with ddd Metoprolol 25 mg PO 90 min prior to scan - resting heart rate is ***60-65 bpm, given BP room with add Metoprolol  50 mg PO 90 min prior to  scan - resting heart rate is ***65-80 bpm, given BP room with add Metoprolol  100 mg PO 90 min prior to scan - - resting heart rate is ***> 80bpm, given BP room with  add ivabradine 15 mg PO 120 min prior to scan  - GFR is *** necessitating contrast limit of ***  - Would recommend exercise/pharmacological ***nuclear medicine stress test  - PET study - (NPO at midnight/hold beta blocker in AM); discussed risks, benefits, and alternatives of the diagnostic procedure including chest pain, arrhythmia,  and death.  Patient amenable for testing.       {Are you ordering a CV Procedure (e.g. stress test, cath, DCCV, TEE, etc)?   Press F2        :[id number]}    Medication Adjustments/Labs and Tests Ordered: Current medicines are reviewed at length with the patient today.  Concerns regarding medicines are outlined above.  No orders of the defined types were placed in this encounter.  No orders of the defined types were placed in this encounter.   There are no Patient Instructions on file for this visit.   Signed, Christell Constant, MD  05/01/2022 5:25 PM    [facility]

## 2022-05-02 ENCOUNTER — Ambulatory Visit: Payer: BC Managed Care – PPO | Attending: Internal Medicine | Admitting: Internal Medicine

## 2022-05-14 ENCOUNTER — Telehealth: Payer: Self-pay | Admitting: Family Medicine

## 2022-05-14 NOTE — Telephone Encounter (Signed)
Prescription Request  05/14/2022  LOV: 11/29/2021  What is the name of the medication or equipment? amphetamine-dextroamphetamine (ADDERALL XR) 15 MG 24 hr capsule . Pt has cpe sch for 06-01-2022  Have you contacted your pharmacy to request a refill? No   Which pharmacy would you like this sent to?  [id number] - [place], [place] - [address] [place] [address] Phone: [phone] Fax: [phone]    Patient notified that their request is being sent to the clinical staff for review and that they should receive a response within 2 business days.   Please advise at Mobile [phone] (mobile)

## 2022-05-15 DIAGNOSIS — M25562 Pain in left knee: Secondary | ICD-10-CM | POA: Diagnosis not present

## 2022-05-16 NOTE — Telephone Encounter (Signed)
Pt LOV was on 11/29/21 Last refill was done on 04/17/22 Please advise

## 2022-05-17 MED ORDER — AMPHETAMINE-DEXTROAMPHET ER 15 MG PO CP24: 15.0000 mg | ORAL_CAPSULE | ORAL | 0 refills | Status: DC

## 2022-05-17 NOTE — Telephone Encounter (Signed)
Done

## 2022-05-28 DIAGNOSIS — M25562 Pain in left knee: Secondary | ICD-10-CM | POA: Diagnosis not present

## 2022-05-28 DIAGNOSIS — M25561 Pain in right knee: Secondary | ICD-10-CM | POA: Diagnosis not present

## 2022-06-01 ENCOUNTER — Ambulatory Visit (INDEPENDENT_AMBULATORY_CARE_PROVIDER_SITE_OTHER): Payer: BC Managed Care – PPO | Admitting: Family Medicine

## 2022-06-01 ENCOUNTER — Encounter: Payer: Self-pay | Admitting: Family Medicine

## 2022-06-01 VITALS — BP 128/92 | HR 63 | Temp 98.2°F | Ht 67.5 in | Wt 192.2 lb

## 2022-06-01 DIAGNOSIS — F418 Other specified anxiety disorders: Secondary | ICD-10-CM | POA: Insufficient documentation

## 2022-06-01 DIAGNOSIS — Z0000 Encounter for general adult medical examination without abnormal findings: Secondary | ICD-10-CM | POA: Diagnosis not present

## 2022-06-01 LAB — BASIC METABOLIC PANEL
BUN: 16 mg/dL (ref 6–23)
CO2: 30 mEq/L (ref 19–32)
Calcium: 9.9 mg/dL (ref 8.4–10.5)
Chloride: 101 mEq/L (ref 96–112)
Creatinine, Ser: 0.99 mg/dL (ref 0.40–1.20)
GFR: 64.96 mL/min (ref >60.00)
Glucose, Bld: 87 mg/dL (ref 70–99)
Potassium: 4.3 mEq/L (ref 3.5–5.1)
Sodium: 138 mEq/L (ref 135–145)

## 2022-06-01 LAB — HEPATIC FUNCTION PANEL
ALT: 20 U/L (ref 0–35)
AST: 19 U/L (ref 0–37)
Albumin: 4.4 g/dL (ref 3.5–5.2)
Alkaline Phosphatase: 83 U/L (ref 39–117)
Bilirubin, Direct: 0.1 mg/dL (ref 0.0–0.3)
Total Bilirubin: 0.5 mg/dL (ref 0.2–1.2)
Total Protein: 7.7 g/dL (ref 6.0–8.3)

## 2022-06-01 LAB — CBC WITH DIFFERENTIAL/PLATELET
Basophils Absolute: 0.1 10*3/uL (ref 0.0–0.1)
Basophils Relative: 0.9 % (ref 0.0–3.0)
Eosinophils Absolute: 0.2 10*3/uL (ref 0.0–0.7)
Eosinophils Relative: 2.7 % (ref 0.0–5.0)
HCT: 43.2 % (ref 36.0–46.0)
Hemoglobin: 14.7 g/dL (ref 12.0–15.0)
Lymphocytes Relative: 36.5 % (ref 12.0–46.0)
Lymphs Abs: 2.4 10*3/uL (ref 0.7–4.0)
MCHC: 34 g/dL (ref 30.0–36.0)
MCV: 91.1 fl (ref 78.0–100.0)
Monocytes Absolute: 0.5 10*3/uL (ref 0.1–1.0)
Monocytes Relative: 8.1 % (ref 3.0–12.0)
Neutro Abs: 3.4 10*3/uL (ref 1.4–7.7)
Neutrophils Relative %: 51.8 % (ref 43.0–77.0)
Platelets: 259 10*3/uL (ref 150.0–400.0)
RBC: 4.75 Mil/uL (ref 3.87–5.11)
RDW: 12.7 % (ref 11.5–15.5)
WBC: 6.6 10*3/uL (ref 4.0–10.5)

## 2022-06-01 LAB — LIPID PANEL
Cholesterol: 241 mg/dL — ABNORMAL HIGH (ref 0–200)
HDL: 54.5 mg/dL (ref >39.00)
LDL Cholesterol: 156 mg/dL — ABNORMAL HIGH (ref 0–99)
NonHDL: 186.59
Total CHOL/HDL Ratio: 4
Triglycerides: 154 mg/dL — ABNORMAL HIGH (ref 0.0–149.0)
VLDL: 30.8 mg/dL (ref 0.0–40.0)

## 2022-06-01 LAB — TSH: TSH: 0.31 u[IU]/mL — ABNORMAL LOW (ref 0.35–5.50)

## 2022-06-01 LAB — HEMOGLOBIN A1C: Hgb A1c MFr Bld: 5.6 % (ref 4.6–6.5)

## 2022-06-01 MED ORDER — VENLAFAXINE HCL ER 75 MG PO CP24: 75.0000 mg | ORAL_CAPSULE | Freq: Every day | ORAL | 2 refills | Status: DC

## 2022-06-01 NOTE — Progress Notes (Signed)
Subjective:    Patient ID: Chloe Conley, female    DOB: Jun 06, 1970, 52 y.o.   MRN: [id number]  HPI Here for a well exam. She feels well physically but she has been very stressed the past 2 months. She is dealing with intense family issues and she asks for help. Her sleep and appetite are intact. She has been taking Prozac for about 4 years now, and it no longer helps the way it used to.    Review of Systems  Constitutional: Negative.   HENT: Negative.    Eyes: Negative.   Respiratory: Negative.    Cardiovascular: Negative.   Gastrointestinal: Negative.   Genitourinary:  Negative for decreased urine volume, difficulty urinating, dyspareunia, dysuria, enuresis, flank pain, frequency, hematuria, pelvic pain and urgency.  Musculoskeletal: Negative.   Skin: Negative.   Neurological: Negative.  Negative for headaches.  Psychiatric/Behavioral:  Positive for decreased concentration and dysphoric mood. Negative for agitation, behavioral problems, confusion, hallucinations, self-injury, sleep disturbance and suicidal ideas. The patient is nervous/anxious.        Objective:   Physical Exam Constitutional:      General: She is not in acute distress.    Appearance: She is well-developed.  HENT:     Head: Normocephalic and atraumatic.     Right Ear: External ear normal.     Left Ear: External ear normal.     Nose: Nose normal.     Mouth/Throat:     Pharynx: No oropharyngeal exudate.  Eyes:     General: No scleral icterus.    Conjunctiva/sclera: Conjunctivae normal.     Pupils: Pupils are equal, round, and reactive to light.  Neck:     Thyroid: No thyromegaly.     Vascular: No JVD.  Cardiovascular:     Rate and Rhythm: Normal rate and regular rhythm.     Heart sounds: Normal heart sounds. No murmur heard.    No friction rub. No gallop.  Pulmonary:     Effort: Pulmonary effort is normal. No respiratory distress.     Breath sounds: Normal breath sounds. No wheezing or rales.   Chest:     Chest wall: No tenderness.  Abdominal:     General: Bowel sounds are normal. There is no distension.     Palpations: Abdomen is soft. There is no mass.     Tenderness: There is no abdominal tenderness. There is no guarding or rebound.  Musculoskeletal:        General: No tenderness. Normal range of motion.     Cervical back: Normal range of motion and neck supple.  Lymphadenopathy:     Cervical: No cervical adenopathy.  Skin:    General: Skin is warm and dry.     Findings: No erythema or rash.  Neurological:     Mental Status: She is alert and oriented to person, place, and time.     Cranial Nerves: No cranial nerve deficit.     Motor: No abnormal muscle tone.     Coordination: Coordination normal.     Deep Tendon Reflexes: Reflexes are normal and symmetric. Reflexes normal.  Psychiatric:        Behavior: Behavior normal.        Thought Content: Thought content normal.        Judgment: Judgment normal.           Assessment & Plan:  Well exam. We discussed diet and exercise. Get fasting labs. For the anxiety and depression, we will stop  Prozac and try Effexor XR 75 mg daily. Follow up in 3-4 weeks. Her colonoscopy is due in December. Gershon Crane, MD

## 2022-06-07 ENCOUNTER — Telehealth: Payer: Self-pay | Admitting: Family Medicine

## 2022-06-07 NOTE — Telephone Encounter (Signed)
Calling to advise provider that her insurance covers zepbound. Says it was discussed at last appt on 06/01/22

## 2022-06-09 DIAGNOSIS — R21 Rash and other nonspecific skin eruption: Secondary | ICD-10-CM | POA: Diagnosis not present

## 2022-06-18 MED ORDER — ZEPBOUND 2.5 MG/0.5ML ~~LOC~~ SOAJ: 2.5000 mg | SUBCUTANEOUS | 0 refills | Status: DC

## 2022-06-18 NOTE — Telephone Encounter (Signed)
I sent in a 4 week supply to start this at 2.5 mg weekly. She should report back after that

## 2022-06-18 NOTE — Addendum Note (Signed)
Addended by: Gershon Crane A on: 06/18/2022 07:44 AM   Modules accepted: Orders

## 2022-06-19 NOTE — Telephone Encounter (Signed)
Pt notified via My Chart

## 2022-06-20 ENCOUNTER — Other Ambulatory Visit: Payer: Self-pay

## 2022-06-20 NOTE — Telephone Encounter (Signed)
Winry Wallman (Key: [id number])  Your information has been submitted to [employer]. To check for an updated outcome later, reopen this PA request from your dashboard.  If [employer] has not responded to your request within 24 hours, contact [employer] at [phone]. If you think there may be a problem with your PA request, use our live chat feature at the bottom right.

## 2022-06-23 ENCOUNTER — Other Ambulatory Visit: Payer: Self-pay | Admitting: Family Medicine

## 2022-06-25 ENCOUNTER — Telehealth: Payer: Self-pay | Admitting: Family Medicine

## 2022-06-25 NOTE — Telephone Encounter (Signed)
Pt does not like have the venlafaxine XR (EFFEXOR XR) 75 MG 24 hr capsule  make her stomach feels and would like to switch back to celexa

## 2022-06-25 NOTE — Telephone Encounter (Signed)
Stop the Effexor and call in Celexa 40 mg daily, #30 with 5 rf

## 2022-06-26 ENCOUNTER — Other Ambulatory Visit: Payer: Self-pay

## 2022-06-26 MED ORDER — CITALOPRAM HYDROBROMIDE 40 MG PO TABS: 40.0000 mg | ORAL_TABLET | Freq: Every day | ORAL | 5 refills | Status: DC

## 2022-06-26 NOTE — Telephone Encounter (Signed)
Pt new Rx for Celexa 40 mg was sent to pt pharmacy, Effexor Rx d/c per Dr Clent Ridges advise, pt notified

## 2022-09-10 ENCOUNTER — Other Ambulatory Visit: Payer: Self-pay | Admitting: Family Medicine

## 2022-09-12 NOTE — Telephone Encounter (Signed)
Pt LOV was on 06/01/22 Last refill done on 07/17/22 Please advise

## 2022-09-13 MED ORDER — AMPHETAMINE-DEXTROAMPHET ER 15 MG PO CP24: 15.0000 mg | ORAL_CAPSULE | ORAL | 0 refills | Status: DC

## 2022-09-13 NOTE — Telephone Encounter (Signed)
Done

## 2022-10-23 DIAGNOSIS — E669 Obesity, unspecified: Secondary | ICD-10-CM | POA: Diagnosis not present

## 2022-10-23 DIAGNOSIS — Z1211 Encounter for screening for malignant neoplasm of colon: Secondary | ICD-10-CM | POA: Diagnosis not present

## 2022-10-23 DIAGNOSIS — F32A Depression, unspecified: Secondary | ICD-10-CM | POA: Diagnosis not present

## 2022-10-23 DIAGNOSIS — E782 Mixed hyperlipidemia: Secondary | ICD-10-CM | POA: Diagnosis not present

## 2022-10-24 ENCOUNTER — Other Ambulatory Visit: Payer: Self-pay | Admitting: Family Medicine

## 2022-10-26 MED ORDER — ZEPBOUND 2.5 MG/0.5ML ~~LOC~~ SOAJ: 2.5000 mg | SUBCUTANEOUS | 5 refills | Status: DC

## 2022-10-30 ENCOUNTER — Other Ambulatory Visit (HOSPITAL_COMMUNITY): Payer: Self-pay

## 2022-10-30 ENCOUNTER — Telehealth: Payer: Self-pay

## 2022-10-30 NOTE — Telephone Encounter (Signed)
Pharmacy Patient Advocate Encounter   Received notification from CoverMyMeds that prior authorization for Zepbound is required/requested.   Insurance verification completed.   The patient is insured through [facility] .   Per test claim: PA required; PA submitted to [facility] via CoverMyMeds Key/confirmation #/EOC Key: [id number]  Status is pending

## 2022-11-12 ENCOUNTER — Other Ambulatory Visit (HOSPITAL_COMMUNITY): Payer: Self-pay

## 2022-11-12 NOTE — Telephone Encounter (Signed)
I have spoke with the Patients plan and was informed that an Appeal  determination was made on late Friday 10/25, and was yet again denied ''Due to the patients Metrics'' The next step in this process is a Theatre manager . I have verbally asked that another Denial letter is faxed out with the current denial date. This Denial letter will be attached in the patients media once received.

## 2022-11-15 NOTE — Telephone Encounter (Signed)
Pt was notified via MyChart.

## 2022-11-15 NOTE — Telephone Encounter (Signed)
As per [employer],    tirzepatide (ZEPBOUND) 2.5 MG/0.5ML Pen  was denied twice by pharmacy operations   To appeal, contact: Consumer grievances [phone] Fax [phone]  *Needs completed and signed valid member release form

## 2022-11-19 NOTE — Telephone Encounter (Signed)
There is no reason to pursue this any further. This will obviously not be approved. She can try using [employer] to pay cash for it

## 2022-11-20 NOTE — Telephone Encounter (Signed)
Pt notified via My Chart

## 2022-12-01 ENCOUNTER — Other Ambulatory Visit: Payer: Self-pay | Admitting: Family Medicine

## 2022-12-03 MED ORDER — BIMATOPROST 0.03 % EX SOLN: 1.0000 "application " | Freq: Every day | CUTANEOUS | 12 refills | Status: AC

## 2022-12-07 ENCOUNTER — Other Ambulatory Visit (HOSPITAL_COMMUNITY): Payer: Self-pay

## 2022-12-07 ENCOUNTER — Telehealth: Payer: Self-pay

## 2022-12-07 NOTE — Telephone Encounter (Signed)
Pharmacy Patient Advocate Encounter   Received notification from Patient Advice Request messages that prior authorization for AMPHETAMINE-DEXTROAMPHETAMINE 15MG  is required/requested.     Per test claim: The current 30 day co-pay is, $30.  No PA needed at this time. This test claim was processed through [facility]- copay amounts may vary at other pharmacies due to pharmacy/plan contracts, or as the patient moves through the different stages of their insurance plan.

## 2022-12-12 NOTE — Telephone Encounter (Signed)
Pt calling reqeusting this be sent to  [id number] - [place], [place] - [address] Phone: [phone]  Fax: [phone]

## 2022-12-17 ENCOUNTER — Other Ambulatory Visit: Payer: Self-pay

## 2022-12-17 MED ORDER — CITALOPRAM HYDROBROMIDE 40 MG PO TABS: 40.0000 mg | ORAL_TABLET | Freq: Every day | ORAL | 5 refills | Status: DC

## 2022-12-17 MED ORDER — AMPHETAMINE-DEXTROAMPHET ER 15 MG PO CP24: 15.0000 mg | ORAL_CAPSULE | ORAL | 0 refills | Status: DC

## 2022-12-17 NOTE — Telephone Encounter (Signed)
Spoke to pt and inform her that Dr. Clent Ridges has sent in her prescription in to her pharmacy and also update to pt on PA status. Pt verbalized understanding.   Pt mention that she sent a mychart to Dr. Claris Che nurse on needing a refill on Celexa. Inform her, will let Dr. Claris Che CMA know.

## 2022-12-17 NOTE — Telephone Encounter (Signed)
Done

## 2022-12-17 NOTE — Addendum Note (Signed)
Addended by: Gershon Crane A on: 12/17/2022 08:52 AM   Modules accepted: Orders

## 2022-12-18 ENCOUNTER — Other Ambulatory Visit: Payer: Self-pay | Admitting: Family Medicine

## 2022-12-28 DIAGNOSIS — R82998 Other abnormal findings in urine: Secondary | ICD-10-CM | POA: Diagnosis not present

## 2022-12-28 DIAGNOSIS — Z1231 Encounter for screening mammogram for malignant neoplasm of breast: Secondary | ICD-10-CM | POA: Diagnosis not present

## 2022-12-28 DIAGNOSIS — Z124 Encounter for screening for malignant neoplasm of cervix: Secondary | ICD-10-CM | POA: Diagnosis not present

## 2022-12-28 DIAGNOSIS — R8290 Unspecified abnormal findings in urine: Secondary | ICD-10-CM | POA: Diagnosis not present

## 2022-12-28 DIAGNOSIS — N841 Polyp of cervix uteri: Secondary | ICD-10-CM | POA: Diagnosis not present

## 2022-12-28 DIAGNOSIS — Z01411 Encounter for gynecological examination (general) (routine) with abnormal findings: Secondary | ICD-10-CM | POA: Diagnosis not present

## 2023-01-04 DIAGNOSIS — Z1211 Encounter for screening for malignant neoplasm of colon: Secondary | ICD-10-CM | POA: Diagnosis not present

## 2023-01-04 HISTORY — PX: COLONOSCOPY: SHX174

## 2023-01-04 LAB — HM COLONOSCOPY

## 2023-01-28 ENCOUNTER — Other Ambulatory Visit: Payer: Self-pay | Admitting: Family Medicine

## 2023-01-28 NOTE — Telephone Encounter (Signed)
 Copied from CRM [id number]. Topic: Clinical - Medication Refill >> Jan 28, 2023  1:32 PM Drema MATSU wrote: Most Recent Primary Care Visit:  Provider: JOHNNY SENIOR A  Department: [facility]  Visit Type: PHYSICAL  Date: 06/01/2022  Medication: ***  Has the patient contacted their pharmacy?  (Agent: If no, request that the patient contact the pharmacy for the refill. If patient does not wish to contact the pharmacy document the reason why and proceed with request.) (Agent: If yes, when and what did the pharmacy advise?)  Is this the correct pharmacy for this prescription?  If no, delete pharmacy and type the correct one.  This is the patient's preferred pharmacy:  [id number] [place], [place] - [address] [place] [address] Phone: [phone] Fax: [phone]   Has the prescription been filled recently?   Is the patient out of the medication?   Has the patient been seen for an appointment in the last year OR does the patient have an upcoming appointment?   Can we respond through MyChart?   Agent: Please be advised that Rx refills may take up to 3 business days. We ask that you follow-up with your pharmacy.

## 2023-01-29 ENCOUNTER — Encounter: Payer: Self-pay | Admitting: Family Medicine

## 2023-02-01 NOTE — Telephone Encounter (Signed)
I suggest she call [facility] (they have several excellent female therapists)

## 2023-02-06 ENCOUNTER — Other Ambulatory Visit: Payer: Self-pay | Admitting: Family Medicine

## 2023-02-14 MED ORDER — ALPRAZOLAM 0.5 MG PO TABS: 0.5000 mg | ORAL_TABLET | Freq: Three times a day (TID) | ORAL | 0 refills | Status: DC | PRN

## 2023-02-14 NOTE — Telephone Encounter (Signed)
I sent in some Xanax.

## 2023-02-20 DIAGNOSIS — L918 Other hypertrophic disorders of the skin: Secondary | ICD-10-CM | POA: Diagnosis not present

## 2023-03-13 ENCOUNTER — Other Ambulatory Visit: Payer: Self-pay | Admitting: Family Medicine

## 2023-03-15 DIAGNOSIS — R87615 Unsatisfactory cytologic smear of cervix: Secondary | ICD-10-CM | POA: Diagnosis not present

## 2023-03-29 ENCOUNTER — Other Ambulatory Visit: Payer: Self-pay | Admitting: Family Medicine

## 2023-04-01 MED ORDER — AMPHETAMINE-DEXTROAMPHET ER 15 MG PO CP24: 15.0000 mg | ORAL_CAPSULE | ORAL | 0 refills | Status: DC

## 2023-04-01 NOTE — Telephone Encounter (Signed)
 Done

## 2023-04-13 ENCOUNTER — Other Ambulatory Visit: Payer: Self-pay | Admitting: Family Medicine

## 2023-04-26 ENCOUNTER — Other Ambulatory Visit: Payer: Self-pay | Admitting: Family Medicine

## 2023-05-02 ENCOUNTER — Other Ambulatory Visit: Payer: Self-pay | Admitting: Family Medicine

## 2023-05-07 NOTE — Telephone Encounter (Signed)
 There is still one refill dated 05-02-23 that she can pick up

## 2023-05-12 ENCOUNTER — Other Ambulatory Visit: Payer: Self-pay | Admitting: Family Medicine

## 2023-05-13 MED ORDER — ALPRAZOLAM 0.5 MG PO TABS: 0.5000 mg | ORAL_TABLET | Freq: Three times a day (TID) | ORAL | 5 refills | Status: AC | PRN

## 2023-05-13 MED ORDER — AMPHETAMINE-DEXTROAMPHET ER 15 MG PO CP24: 15.0000 mg | ORAL_CAPSULE | ORAL | 0 refills | Status: DC

## 2023-05-13 NOTE — Telephone Encounter (Signed)
 Done

## 2023-05-17 ENCOUNTER — Other Ambulatory Visit: Payer: Self-pay | Admitting: Family Medicine

## 2023-06-18 ENCOUNTER — Other Ambulatory Visit: Payer: Self-pay | Admitting: Family Medicine

## 2023-07-11 ENCOUNTER — Other Ambulatory Visit: Payer: Self-pay | Admitting: Family Medicine

## 2023-10-02 ENCOUNTER — Other Ambulatory Visit: Payer: Self-pay | Admitting: Family Medicine

## 2023-10-09 ENCOUNTER — Telehealth: Payer: Self-pay | Admitting: *Deleted

## 2023-10-09 DIAGNOSIS — E785 Hyperlipidemia, unspecified: Secondary | ICD-10-CM

## 2023-10-09 DIAGNOSIS — R739 Hyperglycemia, unspecified: Secondary | ICD-10-CM

## 2023-10-09 NOTE — Telephone Encounter (Signed)
 Copied from CRM #[id number]. Topic: Clinical - Request for Lab/Test Order >> Oct 09, 2023 12:33 PM Carlyon D wrote: Reason for CRM: Pt would like lab orders entered prior to her physical  on 10/10 please reach out to pt when lab orders are entered to schedule pt for lab visit prior to physical appt.

## 2023-10-10 NOTE — Telephone Encounter (Signed)
 Done

## 2023-10-25 ENCOUNTER — Encounter: Payer: Self-pay | Admitting: Family Medicine

## 2023-11-01 ENCOUNTER — Ambulatory Visit (INDEPENDENT_AMBULATORY_CARE_PROVIDER_SITE_OTHER): Payer: Self-pay | Admitting: Family Medicine

## 2023-11-01 ENCOUNTER — Encounter: Payer: Self-pay | Admitting: Family Medicine

## 2023-11-01 VITALS — BP 110/68 | HR 67 | Temp 98.2°F | Ht 66.75 in | Wt 170.0 lb

## 2023-11-01 DIAGNOSIS — Z0000 Encounter for general adult medical examination without abnormal findings: Secondary | ICD-10-CM | POA: Diagnosis not present

## 2023-11-01 MED ORDER — AMPHETAMINE-DEXTROAMPHET ER 15 MG PO CP24: 15.0000 mg | ORAL_CAPSULE | ORAL | 0 refills | Status: AC

## 2023-11-01 MED ORDER — PHENTERMINE HCL 37.5 MG PO CAPS: 37.5000 mg | ORAL_CAPSULE | ORAL | 1 refills | Status: DC

## 2023-11-01 NOTE — Progress Notes (Signed)
   Subjective:    Patient ID: Chloe Conley, female    DOB: 1970/05/06, 53 y.o.   MRN: [id number]  HPI Here for a well exam. She feels well. Her ADHD is stable. She asks for help with losing weight. She tried some Semaglutide she ordered off the Internet but she did not like how it made her feel.    Review of Systems  Constitutional: Negative.   HENT: Negative.    Eyes: Negative.   Respiratory: Negative.    Cardiovascular: Negative.   Gastrointestinal: Negative.   Genitourinary:  Negative for decreased urine volume, difficulty urinating, dyspareunia, dysuria, enuresis, flank pain, frequency, hematuria, pelvic pain and urgency.  Musculoskeletal: Negative.   Skin: Negative.   Neurological: Negative.  Negative for headaches.  Psychiatric/Behavioral: Negative.         Objective:   Physical Exam Constitutional:      General: She is not in acute distress.    Appearance: Normal appearance. She is well-developed.  HENT:     Head: Normocephalic and atraumatic.     Right Ear: External ear normal.     Left Ear: External ear normal.     Nose: Nose normal.     Mouth/Throat:     Pharynx: No oropharyngeal exudate.  Eyes:     General: No scleral icterus.    Conjunctiva/sclera: Conjunctivae normal.     Pupils: Pupils are equal, round, and reactive to light.  Neck:     Thyroid: No thyromegaly.     Vascular: No JVD.  Cardiovascular:     Rate and Rhythm: Normal rate and regular rhythm.     Pulses: Normal pulses.     Heart sounds: Normal heart sounds. No murmur heard.    No friction rub. No gallop.  Pulmonary:     Effort: Pulmonary effort is normal. No respiratory distress.     Breath sounds: Normal breath sounds. No wheezing or rales.  Chest:     Chest wall: No tenderness.  Abdominal:     General: Bowel sounds are normal. There is no distension.     Palpations: Abdomen is soft. There is no mass.     Tenderness: There is no abdominal tenderness. There is no guarding or rebound.   Musculoskeletal:        General: No tenderness. Normal range of motion.     Cervical back: Normal range of motion and neck supple.  Lymphadenopathy:     Cervical: No cervical adenopathy.  Skin:    General: Skin is warm and dry.     Findings: No erythema or rash.  Neurological:     General: No focal deficit present.     Mental Status: She is alert and oriented to person, place, and time.     Cranial Nerves: No cranial nerve deficit.     Motor: No abnormal muscle tone.     Coordination: Coordination normal.     Deep Tendon Reflexes: Reflexes are normal and symmetric. Reflexes normal.  Psychiatric:        Mood and Affect: Mood normal.        Behavior: Behavior normal.        Thought Content: Thought content normal.        Judgment: Judgment normal.           Assessment & Plan:  Well exam. We discussed diet and exercise. Get fasting labs. For weight control she will try Phentermine  37.5 mg daily.  Garnette Olmsted, MD

## 2023-11-02 LAB — CBC WITH DIFFERENTIAL/PLATELET
Absolute Lymphocytes: 2542 {cells}/uL (ref 850–3900)
Absolute Monocytes: 568 {cells}/uL (ref 200–950)
Basophils Absolute: 78 {cells}/uL (ref 0–200)
Basophils Relative: 1.1 %
Eosinophils Absolute: 92 {cells}/uL (ref 15–500)
Eosinophils Relative: 1.3 %
HCT: 44.1 % (ref 35.0–45.0)
Hemoglobin: 15 g/dL (ref 11.7–15.5)
MCH: 31.4 pg (ref 27.0–33.0)
MCHC: 34 g/dL (ref 32.0–36.0)
MCV: 92.3 fL (ref 80.0–100.0)
MPV: 11.3 fL (ref 7.5–12.5)
Monocytes Relative: 8 %
Neutro Abs: 3820 {cells}/uL (ref 1500–7800)
Neutrophils Relative %: 53.8 %
Platelets: 291 Thousand/uL (ref 140–400)
RBC: 4.78 Million/uL (ref 3.80–5.10)
RDW: 12.5 % (ref 11.0–15.0)
Total Lymphocyte: 35.8 %
WBC: 7.1 Thousand/uL (ref 3.8–10.8)

## 2023-11-02 LAB — HEPATIC FUNCTION PANEL
AG Ratio: 1.6 (calc) (ref 1.0–2.5)
ALT: 14 U/L (ref 6–29)
AST: 15 U/L (ref 10–35)
Albumin: 4.6 g/dL (ref 3.6–5.1)
Alkaline phosphatase (APISO): 72 U/L (ref 37–153)
Bilirubin, Direct: 0.1 mg/dL (ref 0.0–0.2)
Globulin: 2.8 g/dL (ref 1.9–3.7)
Indirect Bilirubin: 0.3 mg/dL (ref 0.2–1.2)
Total Bilirubin: 0.4 mg/dL (ref 0.2–1.2)
Total Protein: 7.4 g/dL (ref 6.1–8.1)

## 2023-11-02 LAB — BASIC METABOLIC PANEL WITH GFR
BUN: 19 mg/dL (ref 7–25)
CO2: 26 mmol/L (ref 20–32)
Calcium: 9.8 mg/dL (ref 8.6–10.4)
Chloride: 103 mmol/L (ref 98–110)
Creat: 0.92 mg/dL (ref 0.50–1.03)
Glucose, Bld: 82 mg/dL (ref 65–99)
Potassium: 4.6 mmol/L (ref 3.5–5.3)
Sodium: 140 mmol/L (ref 135–146)
eGFR: 74 mL/min/1.73m2 (ref >=60)

## 2023-11-02 LAB — LIPID PANEL
Cholesterol: 255 mg/dL — ABNORMAL HIGH (ref <200)
HDL: 77 mg/dL (ref >=50)
LDL Cholesterol (Calc): 151 mg/dL — ABNORMAL HIGH
Non-HDL Cholesterol (Calc): 178 mg/dL — ABNORMAL HIGH (ref <130)
Total CHOL/HDL Ratio: 3.3 (calc) (ref <5.0)
Triglycerides: 142 mg/dL (ref <150)

## 2023-11-02 LAB — TSH: TSH: 0.52 m[IU]/L

## 2023-11-02 LAB — HEMOGLOBIN A1C
Hgb A1c MFr Bld: 5.3 % (ref <5.7)
Mean Plasma Glucose: 105 mg/dL
eAG (mmol/L): 5.8 mmol/L

## 2023-11-04 ENCOUNTER — Ambulatory Visit: Payer: Self-pay | Admitting: Family Medicine

## 2023-12-25 ENCOUNTER — Encounter: Payer: Self-pay | Admitting: Family Medicine

## 2023-12-25 ENCOUNTER — Ambulatory Visit: Admitting: Family Medicine

## 2023-12-25 VITALS — BP 140/96 | HR 75 | Temp 98.6°F | Ht 66.5 in | Wt 179.0 lb

## 2023-12-25 DIAGNOSIS — R599 Enlarged lymph nodes, unspecified: Secondary | ICD-10-CM

## 2023-12-25 NOTE — Progress Notes (Signed)
° °  Subjective:    Patient ID: Chloe Conley, female    DOB: 1970-05-11, 53 y.o.   MRN: [id number]  HPI Here to check a lump she found in the right armpit several weeks ago. This did not bother her at first, but then 2 days ago it grew a little larger and it became tender. Now today the tenderness has gone and it has gotten a little smaller. She feels fine in general.    Review of Systems  Constitutional: Negative.   Respiratory: Negative.    Cardiovascular: Negative.        Objective:   Physical Exam Constitutional:      Appearance: Normal appearance.  Cardiovascular:     Rate and Rhythm: Normal rate and regular rhythm.     Pulses: Normal pulses.     Heart sounds: Normal heart sounds.  Pulmonary:     Effort: Pulmonary effort is normal.     Breath sounds: Normal breath sounds.  Skin:    Comments: The skin in both armpits has numerous macular red spots, which she says is from shaving. There is a firm mobile <1 cm tender lump in the right axilla below the level of the skin   Neurological:     Mental Status: She is alert.           Assessment & Plan:  This is an axillary lymph which has likely reacted to the irritation in her skin. She will keep the area clean and she will avoid applying deodorant for a week or two. This should resolve in the next week or two. If not, she will return. Garnette Olmsted, MD

## 2023-12-29 ENCOUNTER — Other Ambulatory Visit: Payer: Self-pay | Admitting: Family Medicine

## 2023-12-30 ENCOUNTER — Encounter: Payer: Self-pay | Admitting: Family Medicine

## 2023-12-31 ENCOUNTER — Other Ambulatory Visit: Payer: Self-pay | Admitting: Family Medicine

## 2023-12-31 ENCOUNTER — Telehealth: Payer: Self-pay | Admitting: *Deleted

## 2023-12-31 MED ORDER — ZEPBOUND 2.5 MG/0.5ML ~~LOC~~ SOAJ: 2.5000 mg | SUBCUTANEOUS | 2 refills | Status: DC

## 2023-12-31 NOTE — Telephone Encounter (Signed)
 Copied from CRM #[id number]. Topic: Clinical - Medical Advice >> Dec 27, 2023  8:58 AM Harlene ORN wrote: Reason for CRM: Saw PCP on 12/10 for a cyst in her arm During that visit, her BP was high and was asked to continue monitoring it. Her BP is still running high; latest reading was 170 over 86. Please discuss with patient about being prescribed low dose BP medication.  no physical symptoms of high BP  Either call back on phone or message via MyChart.

## 2023-12-31 NOTE — Telephone Encounter (Signed)
 I sent in a RX for Zepbound  2.5 mg weekly. We can increase the dose after 4 weeks

## 2024-01-01 ENCOUNTER — Other Ambulatory Visit: Payer: Self-pay

## 2024-01-01 ENCOUNTER — Telehealth: Payer: Self-pay

## 2024-01-01 ENCOUNTER — Other Ambulatory Visit (HOSPITAL_COMMUNITY): Payer: Self-pay

## 2024-01-01 DIAGNOSIS — R635 Abnormal weight gain: Secondary | ICD-10-CM

## 2024-01-01 MED ORDER — TIRZEPATIDE-WEIGHT MANAGEMENT 2.5 MG/0.5ML ~~LOC~~ SOLN: 2.5000 mg | SUBCUTANEOUS | 0 refills | Status: DC

## 2024-01-01 NOTE — Telephone Encounter (Signed)
 Pt states that she spoke with Jon the office pharmacist and has been registered for the Zepbound  through [employer]

## 2024-01-01 NOTE — Telephone Encounter (Signed)
 Spoke with pt stated that her BP was high for the past week, stated that she was getting readings over 170/80's, pt states that the BP is back to normal and she is keeping record of daily readings. Advised to call the office if BP readings change. Pt voiced understanding

## 2024-01-01 NOTE — Progress Notes (Addendum)
° °  01/01/2024  Patient ID: Chloe Conley, female   DOB: [birth year], 53 y.o.   MRN: [id number]  Received phone call from patient regarding Zepbound .  She was never able to start the 2.5mg  pens and received notice it is not covered by insurance. Reviewed access options and patient interested in using the [employer] self-pay program.  PCP in agreement, sending order, f/u scheduled  Jon VEAR Lindau, PharmD Clinical Pharmacist [phone]

## 2024-01-01 NOTE — Telephone Encounter (Signed)
 I would like to increase the dose of her Zepbound  to 5 mg weekly if she agrees

## 2024-01-01 NOTE — Telephone Encounter (Signed)
 Spoke with pt states that she spoke with Jon the office pharmacist and has been registered for the Zepbound  through [employer]

## 2024-01-01 NOTE — Telephone Encounter (Signed)
 Left message on machine for patient to return our call

## 2024-01-01 NOTE — Telephone Encounter (Signed)
 Pharmacy Patient Advocate Encounter   Received notification from Physician's Office that prior authorization for Zepbound  2.5 mg/0.5 ml pen is required/requested.   Insurance verification completed.   The patient is insured through [employer].   Per test claim: Per test claim, medication is not covered due to plan/benefit exclusion, PA not submitted at this time

## 2024-01-01 NOTE — Addendum Note (Signed)
 Addended by: LIONELL JON DEL on: 01/01/2024 02:47 PM   Modules accepted: Orders

## 2024-01-22 ENCOUNTER — Other Ambulatory Visit

## 2024-01-22 VITALS — Wt 178.0 lb

## 2024-01-22 DIAGNOSIS — R635 Abnormal weight gain: Secondary | ICD-10-CM

## 2024-01-22 MED ORDER — TIRZEPATIDE-WEIGHT MANAGEMENT 2.5 MG/0.5ML ~~LOC~~ SOLN: 2.5000 mg | SUBCUTANEOUS | 0 refills | Status: DC

## 2024-01-22 NOTE — Progress Notes (Signed)
" ° °  01/22/2024  Patient ID: Chloe Conley, female   DOB: 1970-12-05, 54 y.o.   MRN: [id number]  Contacted patient via telephone to follow up on zepbound  2.5mg  use via the self-pay program with [employer].  Patient is tolerating well, has once injection for this Saturday left on hand. Start Weight at home: 184 lbs Current weight at home : 178 lbs  Patient would like to stay on the zepbound  2.5mg  for another month, requesting refill.  Plan: -Sending refill order for PCP co-sign -Continue to monitor weight at home and be mindful of healthy diet for continued success  Jon VEAR Lindau, PharmD Clinical Pharmacist [phone]  "

## 2024-02-12 ENCOUNTER — Other Ambulatory Visit

## 2024-02-12 ENCOUNTER — Other Ambulatory Visit: Payer: Self-pay | Admitting: Family Medicine

## 2024-02-12 VITALS — Wt 176.5 lb

## 2024-02-12 DIAGNOSIS — R635 Abnormal weight gain: Secondary | ICD-10-CM

## 2024-02-12 MED ORDER — TIRZEPATIDE-WEIGHT MANAGEMENT 5 MG/0.5ML ~~LOC~~ SOLN: 5.0000 mg | SUBCUTANEOUS | 0 refills | Status: AC

## 2024-02-12 NOTE — Progress Notes (Signed)
" ° °  02/12/2024  Patient ID: Chloe Conley, female   DOB: 13-May-1970, 54 y.o.   MRN: [id number]  Contacted patient via telephone to follow up on zepbound  2.5mg  use via the self-pay program with [employer].  Patient is tolerating well, has once injection for this Saturday left on hand. Start Weight at home: 184 lbs Current weight at home : 176.5 lbs  Down 1.5 lbs since last call. Denies any issues with side effects. Would like to go up to 5mg .  Plan: -Sending refill order for PCP co-sign -Continue to monitor weight at home and be mindful of healthy diet for continued success  Jon VEAR Lindau, PharmD Clinical Pharmacist [phone]  "

## 2024-02-13 ENCOUNTER — Other Ambulatory Visit: Payer: Self-pay | Admitting: Family Medicine

## 2024-03-11 ENCOUNTER — Other Ambulatory Visit
